# Patient Record
Sex: Male | Born: 1970 | Race: Black or African American | Hispanic: No | Marital: Married | Smoking: Current every day smoker
Health system: Southern US, Community
[De-identification: ages and names within clinical notes are randomized; demographics above are authoritative.]

## PROBLEM LIST (undated history)

## (undated) DIAGNOSIS — Z21 Asymptomatic human immunodeficiency virus [HIV] infection status: Secondary | ICD-10-CM

## (undated) DIAGNOSIS — J90 Pleural effusion, not elsewhere classified: Secondary | ICD-10-CM

---

## 2012-05-06 ENCOUNTER — Encounter (HOSPITAL_COMMUNITY): Payer: Self-pay | Admitting: Adult Health

## 2012-05-06 ENCOUNTER — Emergency Department (HOSPITAL_COMMUNITY)
Admission: EM | Admit: 2012-05-06 | Discharge: 2012-05-06 | Disposition: A | Discharge status: Home / Self Care | Payer: Self-pay | Attending: Emergency Medicine | Admitting: Emergency Medicine

## 2012-05-06 DIAGNOSIS — F17200 Nicotine dependence, unspecified, uncomplicated: Secondary | ICD-10-CM | POA: Insufficient documentation

## 2012-05-06 DIAGNOSIS — Y92009 Unspecified place in unspecified non-institutional (private) residence as the place of occurrence of the external cause: Secondary | ICD-10-CM | POA: Insufficient documentation

## 2012-05-06 DIAGNOSIS — Z23 Encounter for immunization: Secondary | ICD-10-CM | POA: Insufficient documentation

## 2012-05-06 DIAGNOSIS — T23199A Burn of first degree of multiple sites of unspecified wrist and hand, initial encounter: Secondary | ICD-10-CM | POA: Insufficient documentation

## 2012-05-06 DIAGNOSIS — IMO0002 Reserved for concepts with insufficient information to code with codable children: Secondary | ICD-10-CM | POA: Insufficient documentation

## 2012-05-06 DIAGNOSIS — T304 Corrosion of unspecified body region, unspecified degree: Secondary | ICD-10-CM

## 2012-05-06 MED ORDER — HYDROCODONE-ACETAMINOPHEN 5-500 MG PO TABS: 1.0000 | ORAL_TABLET | Freq: Four times a day (QID) | ORAL | Status: AC | PRN

## 2012-05-06 MED ORDER — TETANUS-DIPHTHERIA TOXOIDS TD 5-2 LFU IM INJ
0.5000 mL | INJECTION | Freq: Once | INTRAMUSCULAR | Status: AC
  Administered 2012-05-06: 0.5 mL via INTRAMUSCULAR
  Filled 2012-05-06: qty 0.5

## 2012-05-06 MED ORDER — HYDROCODONE-ACETAMINOPHEN 5-325 MG PO TABS
1.0000 | ORAL_TABLET | Freq: Once | ORAL | Status: AC
  Administered 2012-05-06: 1 via ORAL
  Filled 2012-05-06: qty 1

## 2012-05-06 NOTE — ED Provider Notes (Signed)
History     CSN: [id number]  Arrival date & time 05/06/12  0000   First MD Initiated Contact with Patient 05/06/12 0133      Chief Complaint  Patient presents with  . Hand Burn    (Consider location/radiation/quality/duration/timing/severity/associated sxs/prior treatment) Patient is a 41 y.o. male presenting with burn. The history is provided by the patient.  Burn The incident occurred 6 to 12 hours ago. The burns occurred at home. The burns occurred while working on a project. The burns were a result of contact with chemicals. The burns are located on the right hand. The burns appear red and painful. The pain is at a severity of 3/10. He has tried nothing for the symptoms.    History reviewed. No pertinent past medical history.  History reviewed. No pertinent past surgical history.  History reviewed. No pertinent family history.  History  Substance Use Topics  . Smoking status: Current Everyday Smoker    Types: Cigarettes  . Smokeless tobacco: Not on file  . Alcohol Use: Yes      Review of Systems  Skin: Positive for wound.    Allergies  Review of patient's allergies indicates no known allergies.  Home Medications   Current Outpatient Rx  Name Route Sig Dispense Refill  . IBUPROFEN 600 MG PO TABS Oral Take 600 mg by mouth every 6 (six) hours as needed. For pain      BP 149/60  Pulse 86  Temp 98.5 F (36.9 C) (Oral)  Resp 16  SpO2 98%  Physical Exam  Constitutional: He is oriented to person, place, and time. He appears well-developed and well-nourished.  HENT:  Head: Normocephalic and atraumatic.  Eyes: Conjunctivae are normal. Pupils are equal, round, and reactive to light.  Neck: Normal range of motion. Neck supple.  Cardiovascular: Normal rate, regular rhythm, normal heart sounds and intact distal pulses.   Pulmonary/Chest: Effort normal and breath sounds normal.  Abdominal: Soft. Bowel sounds are normal.  Neurological: He is alert and oriented to  person, place, and time.  Skin: Skin is warm and dry. There is erythema.       Erythema to rt tips of fingers,  Noncircumfrential,  + cap refill,  Normal sensation  Psychiatric: He has a normal mood and affect. His behavior is normal. Judgment and thought content normal.    ED Course  Procedures (including critical care time)  Labs Reviewed - No data to display No results found.   No diagnosis found.    MDM  + chemical burn to hand, nv intact, noncircumfrential.  Tet updated,  Will analgesia.  Not hydroflouric containing solution.  Will irrigate,  Dress,  Dc to fu        Rosanne Ashing, MD 05/06/12 [phone]

## 2012-05-06 NOTE — ED Notes (Signed)
Pt states understanding of discharge instructions 

## 2012-05-06 NOTE — ED Notes (Signed)
Approx noon today pt was working and got a Engineer, agricultural burn from a oven/grill cleaner. Pt washed burn with soap and water and vinegar as per the bottle said to do. Tonight hand is red and painful, pain is worse if it is touched or wet.

## 2013-08-24 DIAGNOSIS — L089 Local infection of the skin and subcutaneous tissue, unspecified: Secondary | ICD-10-CM | POA: Insufficient documentation

## 2013-08-28 ENCOUNTER — Encounter (HOSPITAL_COMMUNITY): Payer: Self-pay | Admitting: Emergency Medicine

## 2013-08-28 ENCOUNTER — Emergency Department (HOSPITAL_COMMUNITY)
Admission: EM | Admit: 2013-08-28 | Discharge: 2013-08-28 | Disposition: A | Discharge status: Home / Self Care | Payer: Medicaid Other | Attending: Emergency Medicine | Admitting: Emergency Medicine

## 2013-08-28 DIAGNOSIS — L738 Other specified follicular disorders: Secondary | ICD-10-CM | POA: Insufficient documentation

## 2013-08-28 DIAGNOSIS — L739 Follicular disorder, unspecified: Secondary | ICD-10-CM

## 2013-08-28 DIAGNOSIS — Z21 Asymptomatic human immunodeficiency virus [HIV] infection status: Secondary | ICD-10-CM | POA: Insufficient documentation

## 2013-08-28 DIAGNOSIS — F17200 Nicotine dependence, unspecified, uncomplicated: Secondary | ICD-10-CM | POA: Insufficient documentation

## 2013-08-28 HISTORY — DX: Asymptomatic human immunodeficiency virus (hiv) infection status: Z21

## 2013-08-28 MED ORDER — IBUPROFEN 400 MG PO TABS
800.0000 mg | ORAL_TABLET | Freq: Once | ORAL | Status: AC
  Administered 2013-08-28: 800 mg via ORAL
  Filled 2013-08-28: qty 2

## 2013-08-28 MED ORDER — IBUPROFEN 800 MG PO TABS: 800.0000 mg | ORAL_TABLET | Freq: Three times a day (TID) | ORAL | Status: DC

## 2013-08-28 MED ORDER — DIPHENHYDRAMINE HCL 25 MG PO CAPS
25.0000 mg | ORAL_CAPSULE | Freq: Once | ORAL | Status: AC
  Administered 2013-08-28: 25 mg via ORAL
  Filled 2013-08-28: qty 1

## 2013-08-28 MED ORDER — DIPHENHYDRAMINE HCL 25 MG PO TABS: 25.0000 mg | ORAL_TABLET | Freq: Four times a day (QID) | ORAL | Status: DC | PRN

## 2013-08-28 NOTE — ED Notes (Signed)
Pt returns for non-improving rash to scrotum.

## 2013-08-28 NOTE — ED Notes (Signed)
Pt is taking sulfa antibiotics for scrotal rash x 3 days.  States rash is not improving.  Denies urinary s/s or penile discharge.

## 2013-08-28 NOTE — ED Provider Notes (Signed)
CSN: [id number]     Arrival date & time 08/28/13  1622 History   None    This chart was scribed for non-physician practitioner, Francee Piccolo PA-C working with Layla Maw Ward, DO by Arlan Organ, ED Scribe. This patient was seen in room TR10C/TR10C and the patient's care was started at 5:18 PM.   Chief Complaint  Patient presents with  . Rash    The history is provided by the patient. No language interpreter was used.   HPI Comments: Alexander Patton is a 42 y.o. male who presents to the Emergency Department complaining of a rash to the right scrotal area that first appeared 2-3 days ago. Pt describes the discomfort as "itching" and burning". He denies trying benadryl or motrin for the discomfort. Pt also reports "small bumps" to the area. Pt states he saw a previous provider on 08/25/13 for the same complaint and was given Bactroban and Bactrim DS. He says he has not noticed any improvement to the area, but states his symptoms are not worsening. Pt states when using the Bactroban he reports severe burning and was told to d/c medication by the prescriber. He denies any h/o previous bumps. He denies unprotected sex, sleeping in new environments, and hot tube use. He denies scrotal/penile swelling, discharge, fever, abdominal pain, or pain to his buttocks.  Past Medical History  Diagnosis Date  . HIV positive    History reviewed. No pertinent past surgical history. No family history on file. History  Substance Use Topics  . Smoking status: Current Every Day Smoker -- 0.00 packs/day    Types: Cigarettes  . Smokeless tobacco: Not on file  . Alcohol Use: Yes    Review of Systems  Constitutional: Negative for fever.  Gastrointestinal: Negative for nausea.  Genitourinary: Negative for dysuria, flank pain, discharge, penile swelling, scrotal swelling, genital sores, penile pain and testicular pain.  Musculoskeletal: Negative for back pain.  Skin: Positive for rash.    Allergies   Miconazole-zinc oxide-petrolat  Home Medications   Current Outpatient Rx  Name  Route  Sig  Dispense  Refill  . sulfamethoxazole-trimethoprim (BACTRIM DS) 800-160 MG per tablet   Oral   Take 1 tablet by mouth every 12 (twelve) hours. START 12.3.14 for 10 days END 12.13.14         . diphenhydrAMINE (BENADRYL) 25 MG tablet   Oral   Take 1 tablet (25 mg total) by mouth every 6 (six) hours as needed for itching (Rash).   30 tablet   0   . ibuprofen (ADVIL,MOTRIN) 800 MG tablet   Oral   Take 1 tablet (800 mg total) by mouth 3 (three) times daily.   21 tablet   0    Triage Vitals: BP 129/85  Pulse 71  Temp(Src) 97.7 F (36.5 C) (Oral)  Resp 18  Ht 5\' 11"  (1.803 m)  Wt 150 lb (68.04 kg)  BMI 20.93 kg/m2  SpO2 100%  Physical Exam  Nursing note and vitals reviewed. Constitutional: He is oriented to person, place, and time. He appears well-developed and well-nourished. No distress.  HENT:  Head: Normocephalic and atraumatic.  Right Ear: External ear normal.  Left Ear: External ear normal.  Nose: Nose normal.  Mouth/Throat: Oropharynx is clear and moist.  Eyes: Conjunctivae and EOM are normal.  Neck: Normal range of motion. Neck supple.  Cardiovascular: Normal rate.   Pulmonary/Chest: Effort normal.  Abdominal: Soft.  Genitourinary: Testes normal and penis normal. No penile tenderness. No discharge found.  Musculoskeletal:  Normal range of motion.  Lymphadenopathy:       Right: No inguinal adenopathy present.       Left: No inguinal adenopathy present.  Neurological: He is alert and oriented to person, place, and time.  Skin: Skin is warm and dry. Rash noted. Rash is pustular (less than 5 mm; non tender on scrotal region; non erythematous; no drainage noted). He is not diaphoretic.  Psychiatric: He has a normal mood and affect. His behavior is normal.    ED Course  Procedures (including critical care time)  DIAGNOSTIC STUDIES: Oxygen Saturation is 100% on RA,  Normal by my interpretation.    COORDINATION OF CARE: 5:19 PM-Discussed treatment plan with pt at bedside and pt agreed to plan.     Labs Review Labs Reviewed - No data to display Imaging Review No results found.  EKG Interpretation   None       MDM   1. Folliculitis     Afebrile, NAD, non-toxic appearing, AAOx4. Pt w/ rash consistent w/ folliculitis. GU exam otherwise unremarkable. No concern for cellulitis or fournier gangrene. Advised patient to finish antibiotic course, retry Bactroban cream, and use warm compresses to help with rash. Prescribed motrin and benadryl for itching. Advised f/u w/ original prescriber. Return precautions discussed. Patient is agreeable to plan. Patient is stable at time of discharge. Patient d/w with Dr. Elesa Massed, agrees with plan.        I personally performed the services described in this documentation, which was scribed in my presence. The recorded information has been reviewed and is accurate.    Jeannetta Ellis, PA-C 08/28/13 2220

## 2013-08-29 NOTE — ED Provider Notes (Signed)
Medical screening examination/treatment/procedure(s) were performed by non-physician practitioner and as supervising physician I was immediately available for consultation/collaboration.  EKG Interpretation   None         Layla Maw Ward, DO 08/29/13 0000

## 2013-11-27 DIAGNOSIS — M79673 Pain in unspecified foot: Secondary | ICD-10-CM | POA: Insufficient documentation

## 2013-11-27 DIAGNOSIS — B070 Plantar wart: Secondary | ICD-10-CM | POA: Insufficient documentation

## 2013-12-28 ENCOUNTER — Emergency Department (HOSPITAL_COMMUNITY): Payer: Medicaid Other

## 2013-12-28 ENCOUNTER — Inpatient Hospital Stay (HOSPITAL_COMMUNITY)
Admission: EM | Admit: 2013-12-28 | Discharge: 2014-01-01 | DRG: 975 | Disposition: A | Discharge status: Home / Self Care | Payer: Medicaid Other | Attending: Internal Medicine | Admitting: Internal Medicine

## 2013-12-28 ENCOUNTER — Encounter (HOSPITAL_COMMUNITY): Payer: Self-pay | Admitting: Emergency Medicine

## 2013-12-28 DIAGNOSIS — Z79899 Other long term (current) drug therapy: Secondary | ICD-10-CM

## 2013-12-28 DIAGNOSIS — IMO0002 Reserved for concepts with insufficient information to code with codable children: Secondary | ICD-10-CM

## 2013-12-28 DIAGNOSIS — J90 Pleural effusion, not elsewhere classified: Secondary | ICD-10-CM | POA: Diagnosis present

## 2013-12-28 DIAGNOSIS — F17200 Nicotine dependence, unspecified, uncomplicated: Secondary | ICD-10-CM | POA: Diagnosis present

## 2013-12-28 DIAGNOSIS — A1889 Tuberculosis of other sites: Secondary | ICD-10-CM

## 2013-12-28 DIAGNOSIS — B20 Human immunodeficiency virus [HIV] disease: Principal | ICD-10-CM | POA: Diagnosis present

## 2013-12-28 DIAGNOSIS — J189 Pneumonia, unspecified organism: Secondary | ICD-10-CM | POA: Diagnosis present

## 2013-12-28 DIAGNOSIS — E441 Mild protein-calorie malnutrition: Secondary | ICD-10-CM | POA: Diagnosis present

## 2013-12-28 DIAGNOSIS — A159 Respiratory tuberculosis unspecified: Secondary | ICD-10-CM

## 2013-12-28 HISTORY — DX: Pleural effusion, not elsewhere classified: J90

## 2013-12-28 LAB — BASIC METABOLIC PANEL
BUN: 13 mg/dL (ref 6–23)
CHLORIDE: 98 meq/L (ref 96–112)
CO2: 23 meq/L (ref 19–32)
Calcium: 8.4 mg/dL (ref 8.4–10.5)
Creatinine, Ser: 0.9 mg/dL (ref 0.50–1.35)
GFR calc Af Amer: >90 mL/min (ref >90)
GFR calc non Af Amer: >90 mL/min (ref >90)
Glucose, Bld: 105 mg/dL — ABNORMAL HIGH (ref 70–99)
Potassium: 3.9 mEq/L (ref 3.7–5.3)
SODIUM: 135 meq/L — AB (ref 137–147)

## 2013-12-28 LAB — CBC
HCT: 38.1 % — ABNORMAL LOW (ref 39.0–52.0)
Hemoglobin: 13.2 g/dL (ref 13.0–17.0)
MCH: 31 pg (ref 26.0–34.0)
MCHC: 34.6 g/dL (ref 30.0–36.0)
MCV: 89.4 fL (ref 78.0–100.0)
PLATELETS: 261 10*3/uL (ref 150–400)
RBC: 4.26 MIL/uL (ref 4.22–5.81)
RDW: 13.7 % (ref 11.5–15.5)
WBC: 8.3 10*3/uL (ref 4.0–10.5)

## 2013-12-28 LAB — I-STAT TROPONIN, ED: TROPONIN I, POC: 0.01 ng/mL (ref 0.00–0.08)

## 2013-12-28 LAB — PRO B NATRIURETIC PEPTIDE: PRO B NATRI PEPTIDE: 50.8 pg/mL (ref 0–125)

## 2013-12-28 NOTE — ED Notes (Addendum)
Pt states CP since yesterday with nothing making the pain better or worse.  Pt states cough that makes "It feel like everything is coming up."  Pt states had CP over the winter that was similar and was told it was from a cold he had that was also associated with a cough.  Pt states intermitted pain in left side of his back and left hand tingling that is not currently present

## 2013-12-28 NOTE — ED Notes (Signed)
Pt. reports intermittent left chest pain radiating to left arm with slight SOB and occasional dry cough , denies nausea or diaphoresis , no fever or chills.

## 2013-12-29 ENCOUNTER — Encounter (HOSPITAL_COMMUNITY): Payer: Self-pay | Admitting: *Deleted

## 2013-12-29 ENCOUNTER — Inpatient Hospital Stay (HOSPITAL_COMMUNITY): Payer: Medicaid Other

## 2013-12-29 DIAGNOSIS — J189 Pneumonia, unspecified organism: Secondary | ICD-10-CM

## 2013-12-29 DIAGNOSIS — B20 Human immunodeficiency virus [HIV] disease: Principal | ICD-10-CM

## 2013-12-29 DIAGNOSIS — K219 Gastro-esophageal reflux disease without esophagitis: Secondary | ICD-10-CM | POA: Insufficient documentation

## 2013-12-29 DIAGNOSIS — Z21 Asymptomatic human immunodeficiency virus [HIV] infection status: Secondary | ICD-10-CM

## 2013-12-29 DIAGNOSIS — J90 Pleural effusion, not elsewhere classified: Secondary | ICD-10-CM

## 2013-12-29 HISTORY — DX: Pleural effusion, not elsewhere classified: J90

## 2013-12-29 LAB — CBC WITH DIFFERENTIAL/PLATELET
BASOS PCT: 0 % (ref 0–1)
Basophils Absolute: 0 10*3/uL (ref 0.0–0.1)
EOS ABS: 0.4 10*3/uL (ref 0.0–0.7)
Eosinophils Relative: 6 % — ABNORMAL HIGH (ref 0–5)
HCT: 35.8 % — ABNORMAL LOW (ref 39.0–52.0)
HEMOGLOBIN: 12.3 g/dL — AB (ref 13.0–17.0)
LYMPHS ABS: 1.3 10*3/uL (ref 0.7–4.0)
Lymphocytes Relative: 20 % (ref 12–46)
MCH: 30.8 pg (ref 26.0–34.0)
MCHC: 34.4 g/dL (ref 30.0–36.0)
MCV: 89.7 fL (ref 78.0–100.0)
MONOS PCT: 12 % (ref 3–12)
Monocytes Absolute: 0.8 10*3/uL (ref 0.1–1.0)
Neutro Abs: 4.2 10*3/uL (ref 1.7–7.7)
Neutrophils Relative %: 62 % (ref 43–77)
Platelets: 242 10*3/uL (ref 150–400)
RBC: 3.99 MIL/uL — AB (ref 4.22–5.81)
RDW: 13.8 % (ref 11.5–15.5)
WBC: 6.7 10*3/uL (ref 4.0–10.5)

## 2013-12-29 LAB — BODY FLUID CELL COUNT WITH DIFFERENTIAL
EOS FL: 1 %
Lymphs, Fluid: 30 %
Monocyte-Macrophage-Serous Fluid: 0 % — ABNORMAL LOW (ref 50–90)
Neutrophil Count, Fluid: 69 % — ABNORMAL HIGH (ref 0–25)
WBC FLUID: 1700 uL — AB (ref 0–1000)

## 2013-12-29 LAB — PH, BODY FLUID: pH, Fluid: 7.5

## 2013-12-29 LAB — COMPREHENSIVE METABOLIC PANEL
ALBUMIN: 2.5 g/dL — AB (ref 3.5–5.2)
ALT: 11 U/L (ref 0–53)
AST: 16 U/L (ref 0–37)
Alkaline Phosphatase: 64 U/L (ref 39–117)
BUN: 9 mg/dL (ref 6–23)
CHLORIDE: 101 meq/L (ref 96–112)
CO2: 23 mEq/L (ref 19–32)
CREATININE: 0.75 mg/dL (ref 0.50–1.35)
Calcium: 8.3 mg/dL — ABNORMAL LOW (ref 8.4–10.5)
GFR calc Af Amer: >90 mL/min (ref >90)
GFR calc non Af Amer: >90 mL/min (ref >90)
Glucose, Bld: 104 mg/dL — ABNORMAL HIGH (ref 70–99)
Potassium: 3.7 mEq/L (ref 3.7–5.3)
SODIUM: 136 meq/L — AB (ref 137–147)
Total Bilirubin: 0.3 mg/dL (ref 0.3–1.2)
Total Protein: 7.8 g/dL (ref 6.0–8.3)

## 2013-12-29 LAB — LACTATE DEHYDROGENASE, PLEURAL OR PERITONEAL FLUID: LD, Fluid: 378 U/L — ABNORMAL HIGH (ref 3–23)

## 2013-12-29 LAB — TROPONIN I: Troponin I: <0.3 ng/mL (ref <0.30)

## 2013-12-29 LAB — LACTATE DEHYDROGENASE: LDH: 238 U/L (ref 94–250)

## 2013-12-29 LAB — T-HELPER CELLS (CD4) COUNT (NOT AT ARMC)
CD4 % Helper T Cell: 12 % — ABNORMAL LOW (ref 33–55)
CD4 T Cell Abs: 160 /uL — ABNORMAL LOW (ref 400–2700)

## 2013-12-29 LAB — PROTEIN, BODY FLUID: Total protein, fluid: 6.4 g/dL

## 2013-12-29 MED ORDER — DEXTROSE 5 % IV SOLN
1.0000 g | Freq: Once | INTRAVENOUS | Status: AC
  Administered 2013-12-29: 1 g via INTRAVENOUS
  Filled 2013-12-29: qty 10

## 2013-12-29 MED ORDER — DEXTROSE 5 % IV SOLN
500.0000 mg | INTRAVENOUS | Status: DC
  Filled 2013-12-29: qty 500

## 2013-12-29 MED ORDER — AZITHROMYCIN 500 MG IV SOLR
500.0000 mg | Freq: Once | INTRAVENOUS | Status: AC
  Administered 2013-12-29: 500 mg via INTRAVENOUS
  Filled 2013-12-29: qty 500

## 2013-12-29 MED ORDER — SODIUM CHLORIDE 0.9 % IV SOLN
INTRAVENOUS | Status: DC
  Administered 2013-12-29 – 2013-12-31 (×2): 1000 mL via INTRAVENOUS

## 2013-12-29 MED ORDER — SODIUM CHLORIDE 0.9 % IV SOLN
Freq: Once | INTRAVENOUS | Status: AC
  Administered 2013-12-29: 02:00:00 via INTRAVENOUS

## 2013-12-29 MED ORDER — MORPHINE SULFATE 2 MG/ML IJ SOLN
1.0000 mg | INTRAMUSCULAR | Status: DC | PRN
  Administered 2013-12-29 – 2013-12-31 (×7): 1 mg via INTRAVENOUS
  Filled 2013-12-29 (×7): qty 1

## 2013-12-29 MED ORDER — ACETAMINOPHEN 325 MG PO TABS
650.0000 mg | ORAL_TABLET | Freq: Four times a day (QID) | ORAL | Status: DC | PRN
  Administered 2013-12-29: 650 mg via ORAL
  Filled 2013-12-29: qty 2

## 2013-12-29 MED ORDER — LORATADINE 10 MG PO TABS
10.0000 mg | ORAL_TABLET | Freq: Every day | ORAL | Status: DC
  Administered 2013-12-29 – 2014-01-01 (×4): 10 mg via ORAL
  Filled 2013-12-29 (×5): qty 1

## 2013-12-29 MED ORDER — ONDANSETRON HCL 4 MG PO TABS: 4.0000 mg | ORAL_TABLET | Freq: Four times a day (QID) | ORAL | Status: DC | PRN

## 2013-12-29 MED ORDER — SULFAMETHOXAZOLE-TMP DS 800-160 MG PO TABS
1.0000 | ORAL_TABLET | Freq: Every day | ORAL | Status: DC
  Administered 2013-12-29 – 2014-01-01 (×4): 1 via ORAL
  Filled 2013-12-29 (×5): qty 1

## 2013-12-29 MED ORDER — DEXTROSE 5 % IV SOLN
1.0000 g | INTRAVENOUS | Status: DC
  Administered 2013-12-30 – 2013-12-31 (×2): 1 g via INTRAVENOUS
  Filled 2013-12-29 (×3): qty 10

## 2013-12-29 MED ORDER — SULFAMETHOXAZOLE-TMP DS 800-160 MG PO TABS
2.0000 | ORAL_TABLET | Freq: Once | ORAL | Status: AC
  Administered 2013-12-29: 2 via ORAL
  Filled 2013-12-29: qty 2

## 2013-12-29 MED ORDER — ONDANSETRON HCL 4 MG/2ML IJ SOLN: 4.0000 mg | Freq: Four times a day (QID) | INTRAMUSCULAR | Status: DC | PRN

## 2013-12-29 MED ORDER — ELVITEG-COBIC-EMTRICIT-TENOFDF 150-150-200-300 MG PO TABS
1.0000 | ORAL_TABLET | Freq: Every day | ORAL | Status: DC
  Administered 2013-12-29 – 2013-12-31 (×3): 1 via ORAL
  Filled 2013-12-29 (×4): qty 1

## 2013-12-29 MED ORDER — ACETAMINOPHEN 650 MG RE SUPP: 650.0000 mg | Freq: Four times a day (QID) | RECTAL | Status: DC | PRN

## 2013-12-29 MED ORDER — TUBERCULIN PPD 5 UNIT/0.1ML ID SOLN
5.0000 [IU] | Freq: Once | INTRADERMAL | Status: AC
  Administered 2013-12-29: 5 [IU] via INTRADERMAL
  Filled 2013-12-29: qty 0.1

## 2013-12-29 MED ORDER — SODIUM CHLORIDE 0.9 % IJ SOLN
3.0000 mL | Freq: Two times a day (BID) | INTRAMUSCULAR | Status: DC
  Administered 2013-12-29 – 2014-01-01 (×7): 3 mL via INTRAVENOUS

## 2013-12-29 NOTE — Progress Notes (Signed)
Report called to R.N. Patient transfer via bed with R.N. And belongings.to [address] [place]

## 2013-12-29 NOTE — H&P (Signed)
[facility] History and Physical  Alexander RandWilliam Grade BJY:[id number] DOB: 09/24/1971 DOA: 12/28/2013  Referring physician: ER physician. PCP: No PCP Per Patient  Chief Complaint: Chest pain.  HPI: Alexander Patton is a 43 y.o. male with history of HIV being treated [place] and patient states his last CD4 count was 49 2 months ago presents to the ER with complaints of left-sided chest pain. Patient has been having left-sided chest pain last 2 days which increases on deep inspiration and has nonproductive cough. Denies any fever chills night sweats. Denies any recent travel outside [place]. Patient states he has been taking his HIV medications regularly. In the ER chest x-ray shows moderate to large left pleural effusion with possible pneumonia. Patient has been empirically started antibiotics. Patient denies any nausea vomiting abdominal pain diarrhea.   Review of Systems: As presented in the history of presenting illness, rest negative.  Past Medical History  Diagnosis Date  . HIV positive   . Pleural effusion, left 12/29/2013   History reviewed. No pertinent past surgical history. Social History:  reports that he has been smoking Cigarettes.  He has been smoking about 0.00 packs per day. He does not have any smokeless tobacco history on file. He reports that he drinks alcohol. He reports that he does not use illicit drugs. Where does patient live home. Can patient participate in ADLs? Yes.  Allergies  Allergen Reactions  . Miconazole-Zinc Oxide-Petrolat Hives    Family History:  Family History  Problem Relation Age of Onset  . Stroke Mother       Prior to Admission medications   Medication Sig Start Date End Date Taking? Authorizing Provider  elvitegravir-cobicistat-emtricitabine-tenofovir (STRIBILD) 150-150-200-300 MG TABS tablet Take 1 tablet by mouth daily with breakfast.   Yes Historical Provider, MD  loratadine (CLARITIN) 10 MG tablet Take 10 mg by mouth daily.    Yes Historical Provider, MD  naproxen (NAPROSYN) 500 MG tablet Take 500 mg by mouth 2 (two) times daily with a meal.   Yes Historical Provider, MD  sulfamethoxazole-trimethoprim (BACTRIM DS) 800-160 MG per tablet Take 1 tablet by mouth once. For 30 days 11/27/13  Yes Historical Provider, MD    Physical Exam: Filed Vitals:   12/29/13 0022 12/29/13 0045 12/29/13 0100 12/29/13 0120  BP: 134/73 116/78 124/79 127/69  Pulse: 98 93 89 92  Temp: 98.4 F (36.9 C)   98.2 F (36.8 C)  TempSrc: Oral   Oral  Resp: 31 23 26 20   Height:    5\' 11"  (1.803 m)  Weight:    71.4 kg (157 lb 6.5 oz)  SpO2: 97% 95% 95% 98%     General:  Well-developed and moderately nourished.  Eyes: Anicteric no pallor.  ENT: No discharge from the ears eyes nose mouth.  Neck: No mass felt.  Cardiovascular: S1-S2 heard.  Respiratory: Bilateral entry present decreased on the left side.  Abdomen: Soft nontender bowel sounds present. No guarding or rigidity.  Skin: No rash.  Musculoskeletal: No edema.  Psychiatric: Appears normal.  Neurologic: Alert awake oriented to time place and person. Moves all extremities.  Labs on Admission:  Basic Metabolic Panel:  Recent Labs Lab 12/28/13 2220  NA 135*  K 3.9  CL 98  CO2 23  GLUCOSE 105*  BUN 13  CREATININE 0.90  CALCIUM 8.4   Liver Function Tests: No results found for this basename: AST, ALT, ALKPHOS, BILITOT, PROT, ALBUMIN,  in the last 168 hours No results found for this basename: LIPASE, AMYLASE,  in the last 168 hours No results found for this basename: AMMONIA,  in the last 168 hours CBC:  Recent Labs Lab 12/28/13 2220  WBC 8.3  HGB 13.2  HCT 38.1*  MCV 89.4  PLT 261   Cardiac Enzymes: No results found for this basename: CKTOTAL, CKMB, CKMBINDEX, TROPONINI,  in the last 168 hours  BNP (last 3 results)  Recent Labs  12/28/13 2210  PROBNP 50.8   CBG: No results found for this basename: GLUCAP,  in the last 168 hours  Radiological  Exams on Admission: Dg Chest 2 View  12/28/2013   CLINICAL DATA:  Left-sided chest pain.  EXAM: CHEST  2 VIEW  COMPARISON:  None.  FINDINGS: A moderate to large left-sided pleural effusion is noted, with left basilar airspace opacification. This could reflect pneumonia. The right lung appears relatively clear. No pneumothorax is seen.  The heart is mildly enlarged. No acute osseous abnormalities are identified.  IMPRESSION: Moderate to large left-sided pleural effusion, with left basilar airspace opacification. This could reflect pneumonia. Would perform follow-up chest radiograph after completion of treatment for the acute process, to ensure resolution of underlying airspace opacities and exclude an underlying mass.   Electronically Signed   By: Roanna Raider M.D.   On: 12/28/2013 23:06    EKG: Independently reviewed. Normal sinus rhythm.  Assessment/Plan Principal Problem:   Pleural effusion, left Active Problems:   HIV (human immunodeficiency virus infection)   CAP (community acquired pneumonia)   1. Left-sided pleural effusion with pleuritic chest pain - at this time patient has been empirically placed on ceftriaxone and Zithromax for community-acquired pneumonia. I have consulted pulmonary critical care for further recommendations. Patient's chest pain is pleuritic in nature and I have placed patient on when necessary pain relief medications. 2. Community-acquired pneumonia - see #1. 3. HIV - continue present medications. Check CD4 count and viral load. May discuss with infectious disease in a.m.  I have discussed with pulmonary critical care consult and at this time they have wanted patient to be placed on airborne precautions.    Code Status: Full code.  Family Communication: None.  Disposition Plan: Admit to inpatient.    Micahel Omlor N. [facility] Pager [phone].  If 7PM-7AM, please contact night-coverage [email or web address] Password [facility] 12/29/2013, 2:44  AM

## 2013-12-29 NOTE — Progress Notes (Signed)
02 sat 96% on RA and BP 60/40 per machine and 86/51 while thoracentesis procedure on going at around 1215. Also little diaphoretic, cool wash cloth applied to forehead.  Dr. Elisabeth Cara Alva at bedside and instructed to give NS bolus of 250 which pt received & tolerated well. BP 107/71, P 95. After NS bolus.  Will continue to monitor.  Amanda PeaNellie Lenwood Balsam, Charity fundraiserN.

## 2013-12-29 NOTE — Procedures (Addendum)
Thoracentesis Procedure Note  Pre-operative Diagnosis: left pleural effusion  Post-operative Diagnosis: same  Indications: left effusion, HIV  Procedure Details  Bedside US was performed for chest - pleural fluid visualised in left space as echo free space bounded by chest wall, diaphragm 7 atelectatic lung- see pic. Point marked for thoracentesis. [place] was again performed at conclusion of procedure. Residual fluid noted, no signs of pneumothorax. Consent: Informed consent was obtained. Risks of the procedure were discussed including: infection, bleeding, pain, pneumothorax.  Under sterile conditions the patient was positioned. Betadine solution and sterile drapes were utilized.  1% plain lidocaine was used to anesthetize the 7th rib space. Fluid was obtained without any difficulties and minimal blood loss.  A dressing was applied to the wound and wound care instructions were provided.   Findings 1500 ml of amber yellow pleural fluid was obtained. A sample was sent to Pathology for cytology, chemistry and cell counts, as well as for infection analysis.  Complications:  He developed hot flash with sweating whenm local anesthetic given , satn 95% RA, BP 85/54 - 250cc fluid given patient tolerated the procedure well.          Condition: stable  Plan A follow up chest x-ray was ordered. Bed Rest for 2 hours. Tylenol 650 mg. for pain.  Attending Attestation: I was present for the entire procedure, performed by steve Minor NP   Cyril Mourningakesh Terence Bart MD. FCCP. [facility] Pager [phone] If no response call [phone]

## 2013-12-29 NOTE — Progress Notes (Signed)
Patient seen and examined, admitted by Dr. Toniann FailKakrakandy this morning  Briefly 43 year old male with history of HIV, [facility] (states the ID physician is Dr. Uvaldo RisingMcNeil) presented with pleuritic chest pain, left-sided with nonproductive cough. Chest x-ray showed moderate to large left pleural effusion with possible pneumonia.  BP 111/63  Pulse 89  Temp(Src) 98.3 F (36.8 C) (Oral)  Resp 20  Ht 5\' 11"  (1.803 m)  Wt 71.4 kg (157 lb 6.5 oz)  BMI 21.96 kg/m2  SpO2 96%   Left-sided pleural effusion with CP - Dr. Toniann FailKakrakandy has consulted PCCM thoracentesis, rule out empyema or parapneumonic effusion  - Continue IV antibiotics - Continue airborne precautions until cleared by CCM or ID  HIV - Will continue current medications, check CD4 count and viral load, will obtain ID consult   Will follow   RAI,RIPUDEEP M.D. [facility] 12/29/2013, 9:46 AM  Pager: [phone]

## 2013-12-29 NOTE — Care Management Note (Addendum)
    Page 1 of 1   01/01/2014     5:20:35 PM   CARE MANAGEMENT NOTE 01/01/2014  Patient:  Alexander Patton,Alexander Patton   Account Number:  [id number]  Date Initiated:  12/29/2013  Documentation initiated by:  Oletta CohnWOOD,Jarrick Fjeld  Subjective/Objective Assessment:   43 yo male Left-sided pleural effusion with pleuritic chest pain; community-acquired pneumonia//Home with spouse     Action/Plan:   IV abx, thoracentesis//Access for Home Health needs   Anticipated DC Date:  01/01/2014   Anticipated DC Plan:  HOME/SELF CARE      DC Planning Services  CM consult      Choice offered to / List presented to:             Status of service:   Medicare Important Message given?   (If response is "NO", the following Medicare IM given date fields will be blank) Date Medicare IM given:   Date Additional Medicare IM given:    Discharge Disposition:    Per UR Regulation:  Reviewed for med. necessity/level of care/duration of stay  If discussed at Long Length of Stay Meetings, dates discussed:    Comments:  01/01/14 1700 Oletta Cohnamellia Ariani Seier, RN, BSN, [place] [phone] [facility]na Daw[facility]([place]) regarding pt going home today.  [facility] will go to his home on Monday with medications and instructions.  TB [facility] and delivered to pt room.  01/01/14 1330 Mili Piltz, RN, BSN, [employer] [phone] Verified with lab that Health Department has been contacted in regards to pt having TB.  Dr Daiva EvesVan Dam has also been in contact with RN from Health Department; but [place]

## 2013-12-29 NOTE — Consult Note (Signed)
Name: Alexander Patton MRN: [id number] DOB: 08/02/1971    ADMISSION DATE:  12/28/2013 CONSULTATION DATE:  12/29/2013  [facility] SERV[facility]:  [facility]  CHIEF COMPLAINT:  Left Chest Pain  BRIEF PATIENT DESCRIPTION: 43 year old male with history of HIV, presented 4/6 with left sided pleuritic chest pain. CXR in ED revealed large left pleural effusion. PCCM asked to see.   SIGNIFICANT EVENTS / STUDIES:  4/6 admitted  LINES / TUBES:   CULTURES: Blood x2 4/6 >>> Sputum 4/6 >>>  ANTIBIOTICS: Azithromycin 4/6 >>> Rocephin 4/6 >>> Bactrim 4/6 >>>  HISTORY OF PRESENT ILLNESS:  43 year old male, current smoker, with PMH significant for HIV. Was given diagnosis in October 2014. [facility] He has had left chest pain off and on for several months. The pain is described as intermittent, non-radiating, and worse with inhalation. This pain became progressively worse over the past 2-3 days and has been accompanied by SOB which prompted him to come to the hospital. CXR in ED demonstrated a large left pleural effusion. Reports no sick contacts. [facility] and PCCM was asked to see.   PAST MEDICAL HISTORY :  Past Medical History  Diagnosis Date  . HIV positive   . Pleural effusion, left 12/29/2013   History reviewed. No pertinent past surgical history. Prior to Admission medications   Medication Sig Start Date End Date Taking? Authorizing Provider  elvitegravir-cobicistat-emtricitabine-tenofovir (STRIBILD) 150-150-200-300 MG TABS tablet Take 1 tablet by mouth daily with breakfast.   Yes Historical Provider, MD  loratadine (CLARITIN) 10 MG tablet Take 10 mg by mouth daily.   Yes Historical Provider, MD  naproxen (NAPROSYN) 500 MG tablet Take 500 mg by mouth 2 (two) times daily with a meal.   Yes Historical Provider, MD  sulfamethoxazole-trimethoprim (BACTRIM DS) 800-160 MG per tablet Take 1 tablet by mouth once. For 30 days 11/27/13  Yes  Historical Provider, MD   Allergies  Allergen Reactions  . Miconazole-Zinc Oxide-Petrolat Hives    FAMILY HISTORY:  History reviewed. No pertinent family history. SOCIAL HISTORY:  reports that he has been smoking Cigarettes.  He has been smoking about 0.00 packs per day. He does not have any smokeless tobacco history on file. He reports that he drinks alcohol. He reports that he does not use illicit drugs.  REVIEW OF SYSTEMS:    Bolds are positive  Constitutional: weight loss, gain, night sweats, Fevers, chills, fatigue .  HEENT: headaches, Sore throat, sneezing, nasal congestion, post nasal drip, Difficulty swallowing, Tooth/dental problems, visual complaints visual changes, ear ache CV:  chest pain, radiates: ,Orthopnea, PND, swelling in lower extremities, dizziness, palpitations, syncope.  GI  heartburn, indigestion, abdominal pain, nausea, vomiting, diarrhea, change in bowel habits, loss of appetite, bloody stools.  Resp: cough, nonproductive, hemoptysis, dyspnea, chest pain, pleuritic.  Skin: rash or itching or icterus GU: dysuria, change in color of urine, urgency or frequency. flank pain, hematuria  MS: joint pain or swelling. decreased range of motion  Psych: change in mood or affect. depression or anxiety.  Neuro: difficulty with speech, weakness, numbness, ataxia   SUBJECTIVE:   VITAL SIGNS: Temp:  [98.2 F (36.8 C)-99.4 F (37.4 C)] 98.2 F (36.8 C) (04/07 0120) Pulse Rate:  [89-108] 92 (04/07 0120) Resp:  [18-31] 20 (04/07 0120) BP: (100-134)/(69-79) 127/69 mmHg (04/07 0120) SpO2:  [92 %-98 %] 98 % (04/07 0120) Weight:  [71.4 kg (157 lb 6.5 oz)] 71.4  kg (157 lb 6.5 oz) (04/07 0120)  PHYSICAL EXAMINATION: General:  Male of normal body habitus in no distress Neuro:  Alert, oriented HEENT:  [place]/AT, PERRL Neck:  Supple, no JVD noted Cardiovascular:  RRR Lungs:  Diminished L chest Abdomen:  Soft, non-distended Musculoskeletal:  No acute deformity Skin:   Intact   Recent Labs Lab 12/28/13 2220  NA 135*  K 3.9  CL 98  CO2 23  BUN 13  CREATININE 0.90  GLUCOSE 105*    Recent Labs Lab 12/28/13 2220  HGB 13.2  HCT 38.1*  WBC 8.3  PLT 261   Dg Chest 2 View  12/28/2013   CLINICAL DATA:  Left-sided chest pain.  EXAM: CHEST  2 VIEW  COMPARISON:  None.  FINDINGS: A moderate to large left-sided pleural effusion is noted, with left basilar airspace opacification. This could reflect pneumonia. The right lung appears relatively clear. No pneumothorax is seen.  The heart is mildly enlarged. No acute osseous abnormalities are identified.  IMPRESSION: Moderate to large left-sided pleural effusion, with left basilar airspace opacification. This could reflect pneumonia. Would perform follow-up chest radiograph after completion of treatment for the acute process, to ensure resolution of underlying airspace opacities and exclude an underlying mass.   Electronically Signed   By: Roanna Raider M.D.   On: 12/28/2013 23:06    ASSESSMENT / PLAN:  Large left pleural effusion -etiology unknown, cannot rule out TB, DD includes lymphoma, parapneumonic effusion HIV /? AIDS Possible LLL PNA  Check CD4 count  Will plan for diagnostic/therapeutic thoracentesis in AM, discussed risks & benefits of procedure & he is agreeable  Place PPD  Antibiotics per primary team, consider ID input.       Joneen Roach, ACNP [facility] Pager [phone] or [phone]  Independently examined pt, evaluated data & formulated above care plan with NP who scribed this note & edited by me.   ALVA,RAKESH V.

## 2013-12-29 NOTE — ED Notes (Signed)
Pt's blood cultures drawn prior to antibiotics being administered.

## 2013-12-29 NOTE — ED Provider Notes (Signed)
CSN: [id number]     Arrival date & time 12/28/13  2145 History   First MD Initiated Contact with Patient 12/28/13 2315     Chief Complaint  Patient presents with  . Chest Pain     (Consider location/radiation/quality/duration/timing/severity/associated sxs/prior Treatment) HPI Comments: Pt has been having left sided chest pain, off on for few weeks. Pt comes in today with the chest pain, that was more severe than usual. Describes the pain as sharp pains, radiating to the back, and he had associated dib, no nausea, diophoresis. Pt 's pain is constant, worse with walking, deep breathing. Pt's pmhx is significant for HIV AIDS, states his last CD4 was 1 - ?Marland Kitchen Pt has been taking his meds as prescribed. No productive cough, no fevers. Denies drug use, alcohol abuse - smokes 2-3 cigarrettes a day. No pain like this before.  Patient is a 43 y.o. male presenting with chest pain. The history is provided by the patient.  Chest Pain Associated symptoms: cough and shortness of breath   Associated symptoms: no abdominal pain     Past Medical History  Diagnosis Date  . HIV positive    History reviewed. No pertinent past surgical history. No family history on file. History  Substance Use Topics  . Smoking status: Current Every Day Smoker -- 0.00 packs/day    Types: Cigarettes  . Smokeless tobacco: Not on file  . Alcohol Use: Yes    Review of Systems  Constitutional: Negative for activity change and appetite change.  Respiratory: Positive for cough and shortness of breath.   Cardiovascular: Positive for chest pain.  Gastrointestinal: Negative for abdominal pain.  Genitourinary: Negative for dysuria.  Allergic/Immunologic: Positive for immunocompromised state.  All other systems reviewed and are negative.      Allergies  Miconazole-zinc oxide-petrolat  Home Medications   Current Outpatient Rx  Name  Route  Sig  Dispense  Refill  . elvitegravir-cobicistat-emtricitabine-tenofovir  (STRIBILD) 150-150-200-300 MG TABS tablet   Oral   Take 1 tablet by mouth daily with breakfast.         . loratadine (CLARITIN) 10 MG tablet   Oral   Take 10 mg by mouth daily.         . naproxen (NAPROSYN) 500 MG tablet   Oral   Take 500 mg by mouth 2 (two) times daily with a meal.         . sulfamethoxazole-trimethoprim (BACTRIM DS) 800-160 MG per tablet   Oral   Take 1 tablet by mouth once. For 30 days          BP 134/73  Pulse 98  Temp(Src) 98.4 F (36.9 C) (Oral)  Resp 31  SpO2 97% Physical Exam  Nursing note and vitals reviewed. Constitutional: He is oriented to person, place, and time. He appears well-developed.  HENT:  Head: Normocephalic and atraumatic.  Eyes: Conjunctivae and EOM are normal. Pupils are equal, round, and reactive to light.  Neck: Normal range of motion. Neck supple.  Cardiovascular: Normal rate and regular rhythm.   Pulmonary/Chest: Effort normal and breath sounds normal.  No air entry to the left side  Abdominal: Soft. Bowel sounds are normal. He exhibits no distension. There is no tenderness. There is no rebound and no guarding.  Neurological: He is alert and oriented to person, place, and time.  Skin: Skin is warm.    ED Course  Procedures (including critical care time) Labs Review Labs Reviewed  CBC - Abnormal; Notable for the following:  HCT 38.1 (*)    All other components within normal limits  BASIC METABOLIC PANEL - Abnormal; Notable for the following:    Sodium 135 (*)    Glucose, Bld 105 (*)    All other components within normal limits  CULTURE, EXPECTORATED SPUTUM-ASSESSMENT  CULTURE, BLOOD (ROUTINE X 2)  CULTURE, BLOOD (ROUTINE X 2)  PRO B NATRIURETIC PEPTIDE  I-STAT TROPOININ, ED   Imaging Review Dg Chest 2 View  12/28/2013   CLINICAL DATA:  Left-sided chest pain.  EXAM: CHEST  2 VIEW  COMPARISON:  None.  FINDINGS: A moderate to large left-sided pleural effusion is noted, with left basilar airspace opacification.  This could reflect pneumonia. The right lung appears relatively clear. No pneumothorax is seen.  The heart is mildly enlarged. No acute osseous abnormalities are identified.  IMPRESSION: Moderate to large left-sided pleural effusion, with left basilar airspace opacification. This could reflect pneumonia. Would perform follow-up chest radiograph after completion of treatment for the acute process, to ensure resolution of underlying airspace opacities and exclude an underlying mass.   Electronically Signed   By: Roanna RaiderJeffery  Chang M.D.   On: 12/28/2013 23:06     EKG Interpretation None      MDM   Final diagnoses:  CAP (community acquired pneumonia)  Pleural effusion on left  HIV - likely AIDS  Pt w/ HIV comes in with left sided chest pain - mostly on the posterior end. Is exertioanl and pleuritic. No air entry on my exam - Xray shlws large pleural effusion.  DDX - malignant vs. Infectious pleural effusion. Will cover with Ceftriaxone and AZT, and bactrim. Will need evaluation of the effusion. Hemodynamically stable, no O2 need.   Derwood KaplanAnkit Taiki Buckwalter, MD 12/29/13 [phone]

## 2013-12-29 NOTE — Consult Note (Signed)
[facility]    Date of Admission:  12/28/2013  Date of Consult:  12/29/2013  Reason for Consult: Pt with large left sided effusion, PNA, HIV/AIDS Referring Physician: Dr. Tana Coast   HPI: Alexander Patton is an 43 y.o. male with HIV that was diagnosed this fall recently started on STRIBILD with centrally perfect virological suppression with most recent viral load on March 27 being 25 copies per milliliter of blood. CD4 count has yet not yet risen above 200 with most recent bout value at 130.  He states is been highly compliant with his antiretroviral medications and he is enrolled in the AIDS drug assistance program.  He's been having trouble with cough and pleuritic chest pain for some time now but not been evaluated until he was seen in emergent chart department at [facility], and a chest x-ray which are large left-sided pleural effusion.  The patient and CCM and perform thoracocentesis which showed an LDH of 378, protein of 6.4 and 1700 WBC 69% PMNs.   PPD has been placed. He has been in prison for 30 days several years ago but no other potential TB exposure though risk obviously much higher with HIV +.   Past Medical History  Diagnosis Date  . HIV positive   . Pleural effusion, left 12/29/2013    History reviewed. No pertinent past surgical history.ergies:   Allergies  Allergen Reactions  . Miconazole-Zinc Oxide-Petrolat Hives     Medications: I have reviewed patients current medications as documented in Epic Anti-infectives   Start     Dose/Rate Route Frequency Ordered Stop   12/30/13 0200  cefTRIAXone (ROCEPHIN) 1 g in dextrose 5 % 50 mL IVPB     1 g 100 mL/hr over 30 Minutes Intravenous Every 24 hours 12/29/13 0243     12/30/13 0200  azithromycin (ZITHROMAX) 500 mg in dextrose 5 % 250 mL IVPB  Status:  Discontinued     500 mg 250 mL/hr over 60 Minutes Intravenous Every 24 hours 12/29/13 0243 12/29/13 1522   12/29/13 1000   sulfamethoxazole-trimethoprim (BACTRIM DS) 800-160 MG per tablet 1 tablet     1 tablet Oral Daily 12/29/13 0243     12/29/13 0700  elvitegravir-cobicistat-emtricitabine-tenofovir (STRIBILD) 150-150-200-300 MG tablet 1 tablet     1 tablet Oral Daily with breakfast 12/29/13 0243     12/29/13 0030  sulfamethoxazole-trimethoprim (BACTRIM DS) 800-160 MG per tablet 2 tablet     2 tablet Oral  Once 12/29/13 0026 12/29/13 0041   12/29/13 0030  cefTRIAXone (ROCEPHIN) 1 g in dextrose 5 % 50 mL IVPB     1 g 100 mL/hr over 30 Minutes Intravenous  Once 12/29/13 0026 12/29/13 0114   12/29/13 0030  azithromycin (ZITHROMAX) 500 mg in dextrose 5 % 250 mL IVPB     500 mg 250 mL/hr over 60 Minutes Intravenous  Once 12/29/13 0026 12/29/13 0341      Social History:  reports that he has been smoking Cigarettes.  He has been smoking about 0.00 packs per day. He does not have any smokeless tobacco history on file. He reports that he drinks alcohol. He reports that he does not use illicit drugs.  Family History  Problem Relation Age of Onset  . Stroke Mother     As in HPI and primary teams notes otherwise 12 point review of systems is negative  Blood pressure 107/71, pulse 95, temperature 98 F (36.7 C), temperature source Oral, resp. rate 18, height _0  (1.803  m), weight 157 lb 6.5 oz (71.4 kg), SpO2 98.00%. General: Alert and awake, oriented x3, not in any acute distress. HEENT: anicteric sclera, pupils reactive to light and accommodation, EOMI, oropharynx clear and without exudate CVS regular rate, normal r,  no murmur rubs or gallops Chest: Diminished breath sounds in left base  Abdomen: soft nontender, nondistended, normal bowel sounds, Extremities: no  clubbing or edema noted bilaterally Skin: no rashes Neuro: nonfocal, strength and sensation intact   Results for orders placed during the hospital encounter of 12/28/13 (from the past 48 hour(s))  PRO B NATRIURETIC PEPTIDE     Status: None    Collection Time    12/28/13 10:10 PM      Result Value Ref Range   Pro B Natriuretic peptide (BNP) 50.8  0 - 125 pg/mL  CBC     Status: Abnormal   Collection Time    12/28/13 10:20 PM      Result Value Ref Range   WBC 8.3  4.0 - 10.5 K/uL   RBC 4.26  4.22 - 5.81 MIL/uL   Hemoglobin 13.2  13.0 - 17.0 g/dL   HCT 38.1 (*) 39.0 - 52.0 %   MCV 89.4  78.0 - 100.0 fL   MCH 31.0  26.0 - 34.0 pg   MCHC 34.6  30.0 - 36.0 g/dL   RDW 13.7  11.5 - 15.5 %   Platelets 261  150 - 400 K/uL  BASIC METABOLIC PANEL     Status: Abnormal   Collection Time    12/28/13 10:20 PM      Result Value Ref Range   Sodium 135 (*) 137 - 147 mEq/L   Potassium 3.9  3.7 - 5.3 mEq/L   Chloride 98  96 - 112 mEq/L   CO2 23  19 - 32 mEq/L   Glucose, Bld 105 (*) 70 - 99 mg/dL   BUN 13  6 - 23 mg/dL   Creatinine, Ser 0.90  0.50 - 1.35 mg/dL   Calcium 8.4  8.4 - 10.5 mg/dL   GFR calc non Af Amer >90  >90 mL/min   GFR calc Af Amer >90  >90 mL/min   Comment: (NOTE)     The eGFR has been calculated using the CKD EPI equation.     This calculation has not been validated in all clinical situations.     eGFR's persistently <90 mL/min signify possible Chronic Kidney     Disease.  Randolm Idol, ED     Status: None   Collection Time    12/28/13 10:23 PM      Result Value Ref Range   Troponin i, poc 0.01  0.00 - 0.08 ng/mL   Comment 3            Comment: Due to the release kinetics of cTnI,     a negative result within the first hours     of the onset of symptoms does not rule out     myocardial infarction with certainty.     If myocardial infarction is still suspected,     repeat the test at appropriate intervals.  T-HELPER CELLS (CD4) COUNT     Status: Abnormal   Collection Time    12/29/13  5:38 AM      Result Value Ref Range   CD4 T Cell Abs 160 (*) 400 - 2700 /uL   CD4 % Helper T Cell 12 (*) 33 - 55 %   Comment: Performed at [facility]  COMPREHENSIVE METABOLIC PANEL     Status:  Abnormal   Collection Time    12/29/13  5:38 AM      Result Value Ref Range   Sodium 136 (*) 137 - 147 mEq/L   Potassium 3.7  3.7 - 5.3 mEq/L   Chloride 101  96 - 112 mEq/L   CO2 23  19 - 32 mEq/L   Glucose, Bld 104 (*) 70 - 99 mg/dL   BUN 9  6 - 23 mg/dL   Creatinine, Ser 0.75  0.50 - 1.35 mg/dL   Calcium 8.3 (*) 8.4 - 10.5 mg/dL   Total Protein 7.8  6.0 - 8.3 g/dL   Albumin 2.5 (*) 3.5 - 5.2 g/dL   AST 16  0 - 37 U/L   ALT 11  0 - 53 U/L   Alkaline Phosphatase 64  39 - 117 U/L   Total Bilirubin 0.3  0.3 - 1.2 mg/dL   GFR calc non Af Amer >90  >90 mL/min   GFR calc Af Amer >90  >90 mL/min   Comment: (NOTE)     The eGFR has been calculated using the CKD EPI equation.     This calculation has not been validated in all clinical situations.     eGFR's persistently <90 mL/min signify possible Chronic Kidney     Disease.  CBC WITH DIFFERENTIAL     Status: Abnormal   Collection Time    12/29/13  5:38 AM      Result Value Ref Range   WBC 6.7  4.0 - 10.5 K/uL   RBC 3.99 (*) 4.22 - 5.81 MIL/uL   Hemoglobin 12.3 (*) 13.0 - 17.0 g/dL   HCT 35.8 (*) 39.0 - 52.0 %   MCV 89.7  78.0 - 100.0 fL   MCH 30.8  26.0 - 34.0 pg   MCHC 34.4  30.0 - 36.0 g/dL   RDW 13.8  11.5 - 15.5 %   Platelets 242  150 - 400 K/uL   Neutrophils Relative % 62  43 - 77 %   Neutro Abs 4.2  1.7 - 7.7 K/uL   Lymphocytes Relative 20  12 - 46 %   Lymphs Abs 1.3  0.7 - 4.0 K/uL   Monocytes Relative 12  3 - 12 %   Monocytes Absolute 0.8  0.1 - 1.0 K/uL   Eosinophils Relative 6 (*) 0 - 5 %   Eosinophils Absolute 0.4  0.0 - 0.7 K/uL   Basophils Relative 0  0 - 1 %   Basophils Absolute 0.0  0.0 - 0.1 K/uL  TROPONIN I     Status: None   Collection Time    12/29/13  5:38 AM      Result Value Ref Range   Troponin I <0.30  <0.30 ng/mL   Comment:            Due to the release kinetics of cTnI,     a negative result within the first hours     of the onset of symptoms does not rule out     myocardial infarction with  certainty.     If myocardial infarction is still suspected,     repeat the test at appropriate intervals.  LACTATE DEHYDROGENASE, BODY FLUID     Status: Abnormal   Collection Time    12/29/13 12:54 PM      Result Value Ref Range   LD, Fluid 378 (*) 3 - 23 U/L   Fluid Type-FLDH  FLUID     Comment: LEFT     PLEURAL     CORRECTED ON 04/07 AT 1431: PREVIOUSLY REPORTED AS Pleural Fld  PROTEIN, BODY FLUID     Status: None   Collection Time    12/29/13 12:54 PM      Result Value Ref Range   Total protein, fluid 6.4     Comment: NO NORMAL RANGE ESTABLISHED FOR THIS TEST   Fluid Type-FTP FLUID     Comment: LEFT     PLEURAL     CORRECTED ON 04/07 AT 1430: PREVIOUSLY REPORTED AS Pleural Fld  BODY FLUID CELL COUNT WITH DIFFERENTIAL     Status: Abnormal   Collection Time    12/29/13 12:54 PM      Result Value Ref Range   Fluid Type-FCT FLUID     Comment: LEFT     PLEURAL     CORRECTED ON 04/07 AT 1557: PREVIOUSLY REPORTED AS Body Fluid   Color, Fluid YELLOW  YELLOW   Appearance, Fluid CLOUDY (*) CLEAR   WBC, Fluid 1700 (*) 0 - 1000 cu mm   Neutrophil Count, Fluid 69 (*) 0 - 25 %   Lymphs, Fluid 30     Monocyte-Macrophage-Serous Fluid 0 (*) 50 - 90 %   Eos, Fluid 1     Other Cells, Fluid PYNOTIC NEUTROPHILS PRESENT.    LACTATE DEHYDROGENASE     Status: None   Collection Time    12/29/13  3:40 PM      Result Value Ref Range   LDH 238  94 - 250 U/L   No results found for this basename: sdes, specrequest, cult, reptstatus   Dg Chest 2 View  12/28/2013   CLINICAL DATA:  Left-sided chest pain.  EXAM: CHEST  2 VIEW  COMPARISON:  None.  FINDINGS: A moderate to large left-sided pleural effusion is noted, with left basilar airspace opacification. This could reflect pneumonia. The right lung appears relatively clear. No pneumothorax is seen.  The heart is mildly enlarged. No acute osseous abnormalities are identified.  IMPRESSION: Moderate to large left-sided pleural effusion, with left basilar  airspace opacification. This could reflect pneumonia. Would perform follow-up chest radiograph after completion of treatment for the acute process, to ensure resolution of underlying airspace opacities and exclude an underlying mass.   Electronically Signed   By: Garald Balding M.D.   On: 12/28/2013 23:06   Dg Chest Port 1 View  12/29/2013   CLINICAL DATA:  Post left thoracentesis  EXAM: PORTABLE CHEST - 1 VIEW  COMPARISON:  12/28/2013  FINDINGS: Decreased left effusion fall following thoracentesis. Mild to moderate left thoracentesis and left lower lobe atelectasis remain. No pneumothorax. Right lung is clear.  IMPRESSION: No pneumothorax post left thoracentesis.   Electronically Signed   By: Franchot Gallo M.D.   On: 12/29/2013 12:25     No results found for this or any previous visit (from the past 720 hour(s)).   Impression/Recommendation  Principal Problem:   Pleural effusion, left Active Problems:   HIV (human immunodeficiency virus infection)   CAP (community acquired pneumonia)   Earon Rivest is a 43 y.o. male with  HIV/AIDS with perfect virological control, now with left sided exudative effusion  #1 Exudative effusion:  Agree with workup for TB  If possible would add ADA levels. Not sure if we can get IFN gamma test on fluid  Will also look at other possibilities such as PCR  I would induce 3 sputa for AFB  smear and culture to hopefully rule out pulmonary TB  Send QF gold  OK to continue the rocephin but would dc azithro to increase yield on AFB sputa  #2 HIV: --continue STRIBILD and bactrim     12/29/2013, 6:05 PM   Thank you so much for this interesting consult  [place] [facility] [place] [phone] (pager) [phone] (office) 12/29/2013, 6:05 PM  Rhina Brackett [place] 12/29/2013, 6:05 PM

## 2013-12-30 LAB — FUNGAL STAIN: Fungal Smear: NONE SEEN

## 2013-12-30 LAB — HIV-1 RNA QUANT-NO REFLEX-BLD
HIV 1 RNA QUANT: 37 {copies}/mL — AB (ref <20)
HIV-1 RNA QUANT, LOG: 1.57 {Log} — AB (ref <1.30)

## 2013-12-30 NOTE — Progress Notes (Signed)
RN informed RT that PT has sputum induction order. After RT reviewed stated order- RT informed RN that order is for AFB. AFB sample must be induced and provided prior to any ingestion of liquids and / or food. Therefore, RT will inform RT (7a-7p) that this order is pending.

## 2013-12-30 NOTE — Progress Notes (Signed)
[facility] PROGRESS NOTE  Alexander Patton ZOX:[id number] DOB: 10/31/1970 DOA: 12/28/2013 PCP: No PCP Per Patient  Assessment/Plan: 1. SOB and pleuritic chest pain: found with large pleural effusion and presumed LLL PNA -result suggesting exudatives effusion -continue rocephin and bactrim -follow cx's -Id on board will follow rec's -concerns for TB (will continue droplet isolation; PPD negative) -AFB pending. Might need FOB/BAL, PCCM on board and assisting with the case as well  2-HIV/AIDS -continue bactrim -continue stribild   Code Status: Full Family Communication: no family at bedside Disposition Plan: home when medically stable   Consultants:  ID  PCCM   Procedures:  Thoracentesis 12/29/13   Antibiotics:  Rocephin  Bactrim   HPI/Subjective: Afebrile, feeling better; still with some cough and associated pleuritic pain  Objective: Filed Vitals:   12/30/13 1452  BP: 114/72  Pulse: 95  Temp: 98.6 F (37 C)  Resp: 18    Intake/Output Summary (Last 24 hours) at 12/30/13 1731 Last data filed at 12/30/13 1451  Gross per 24 hour  Intake 1134.67 ml  Output   2650 ml  Net -1515.33 ml   Filed Weights   12/29/13 0120 12/30/13 0534  Weight: 71.4 kg (157 lb 6.5 oz) 68.584 kg (151 lb 3.2 oz)    Exam:   General:  Afebrile, feeling better; still with some cough and associated pleuritic pain  Cardiovascular: S1 and S2, no rubs or gallops  Respiratory: scattered rhonchi, no wheezing  Abdomen: soft, NT, NT positive BS  Musculoskeletal: no cyanosis or clubbing  Data Reviewed: Basic Metabolic Panel:  Recent Labs Lab 12/28/13 2220 12/29/13 0538  NA 135* 136*  K 3.9 3.7  CL 98 101  CO2 23 23  GLUCOSE 105* 104*  BUN 13 9  CREATININE 0.90 0.75  CALCIUM 8.4 8.3*   Liver Function Tests:  Recent Labs Lab 12/29/13 0538  AST 16  ALT 11  ALKPHOS 64  BILITOT 0.3  PROT 7.8  ALBUMIN 2.5*   CBC:  Recent Labs Lab 12/28/13 2220  12/29/13 0538  WBC 8.3 6.7  NEUTROABS  --  4.2  HGB 13.2 12.3*  HCT 38.1* 35.8*  MCV 89.4 89.7  PLT 261 242   Cardiac Enzymes:  Recent Labs Lab 12/29/13 0538  TROPONINI <0.30   BNP (last 3 results)  Recent Labs  12/28/13 2210  PROBNP 50.8   CBG: No results found for this basename: GLUCAP,  in the last 168 hours  Recent Results (from the past 240 hour(s))  CULTURE, BLOOD (ROUTINE X 2)     Status: None   Collection Time    12/29/13 12:35 AM      Result Value Ref Range Status   Specimen Description BLOOD RIGHT ANTECUBITAL   Final   Special Requests BOTTLES DRAWN AEROBIC AND ANAEROBIC 10CC EA   Final   Culture  Setup Time     Final   Value: 12/29/2013 04:52     [facility]   Culture     Final   Value:        BLOOD CULTURE RECEIVED NO GROWTH TO DATE CULTURE WILL BE HELD FOR 5 DAYS BEFORE ISSUING A FINAL NEGATIVE REPORT     Performed at [facility]   Report Status PENDING   Incomplete  CULTURE, BLOOD (ROUTINE X 2)     Status: None   Collection Time    12/29/13 12:38 AM      Result Value Ref Range Status   Specimen Description BLOOD LEFT  FOREARM   Final   Special Requests BOTTLES DRAWN AEROBIC AND ANAEROBIC 10CC EA   Final   Culture  Setup Time     Final   Value: 12/29/2013 04:52     Performed at [facility]   Culture     Final   Value:        BLOOD CULTURE RECEIVED NO GROWTH TO DATE CULTURE WILL BE HELD FOR 5 DAYS BEFORE ISSUING A FINAL NEGATIVE REPORT     Performed at [facility]   Report Status PENDING   Incomplete  AFB CULTURE WITH SMEAR     Status: None   Collection Time    12/29/13 12:54 PM      Result Value Ref Range Status   Specimen Description FLUID LEFT PLEURAL   Final   Special Requests NONE   Final   ACID FAST SMEAR     Final   Value: NO ACID FAST BACILLI SEEN     Performed at [facility]   Culture     Final   Value: CULTURE WILL BE EXAMINED FOR 6 WEEKS BEFORE ISSUING A FINAL REPORT      Performed at [facility]   Report Status PENDING   Incomplete  BODY FLUID CULTURE     Status: None   Collection Time    12/29/13 12:54 PM      Result Value Ref Range Status   Specimen Description FLUID LEFT PLEURAL   Final   Special Requests NONE   Final   Gram Stain     Final   Value: WBC PRESENT,BOTH PMN AND MONONUCLEAR     NO ORGANISMS SEEN     Performed at [facility]   Culture     Final   Value: NO GROWTH 1 DAY     Performed at [facility]   Report Status PENDING   Incomplete  FUNGAL STAIN     Status: None   Collection Time    12/29/13 12:54 PM      Result Value Ref Range Status   Specimen Description FLUID FLUID LEFT PLEURAL   Final   Special Requests NONE   Final   Fungal Smear     Final   Value: NO YEAST OR FUNGAL ELEMENTS SEEN     Performed at [facility]   Report Status 12/30/2013 FINAL   Final     Studies: Dg Chest 2 View  12/28/2013   CLINICAL DATA:  Left-sided chest pain.  EXAM: CHEST  2 VIEW  COMPARISON:  None.  FINDINGS: A moderate to large left-sided pleural effusion is noted, with left basilar airspace opacification. This could reflect pneumonia. The right lung appears relatively clear. No pneumothorax is seen.  The heart is mildly enlarged. No acute osseous abnormalities are identified.  IMPRESSION: Moderate to large left-sided pleural effusion, with left basilar airspace opacification. This could reflect pneumonia. Would perform follow-up chest radiograph after completion of treatment for the acute process, to ensure resolution of underlying airspace opacities and exclude an underlying mass.   Electronically Signed   By: Roanna Raider M.D.   On: 12/28/2013 23:06   Dg Chest Port 1 View  12/29/2013   CLINICAL DATA:  Post left thoracentesis  EXAM: PORTABLE CHEST - 1 VIEW  COMPARISON:  12/28/2013  FINDINGS: Decreased left effusion fall following thoracentesis. Mild to moderate left thoracentesis and left lower lobe atelectasis remain.  No pneumothorax. Right lung is clear.  IMPRESSION: No pneumothorax post  left thoracentesis.   Electronically Signed   By: Marlan Palau M.D.   On: 12/29/2013 12:25    Scheduled Meds: . cefTRIAXone (ROCEPHIN)  IV  1 g Intravenous Q24H  . elvitegravir-cobicistat-emtricitabine-tenofovir  1 tablet Oral Q breakfast  . loratadine  10 mg Oral Daily  . sodium chloride  3 mL Intravenous Q12H  . sulfamethoxazole-trimethoprim  1 tablet Oral Daily  . tuberculin  5 Units Intradermal Once   Continuous Infusions: . sodium chloride 1,000 mL (12/29/13 1000)    Time spent: >30 minutes    Vassie Loll  [facility] Pager [phone]. If 7PM-7AM, please contact night-coverage at [email or web address], password [facility] 12/30/2013, 5:31 PM  LOS: 2 days

## 2013-12-30 NOTE — Progress Notes (Signed)
[facility]    Subjective: No new complaints   Antibiotics:  Anti-infectives   Start     Dose/Rate Route Frequency Ordered Stop   12/30/13 0200  cefTRIAXone (ROCEPHIN) 1 g in dextrose 5 % 50 mL IVPB     1 g 100 mL/hr over 30 Minutes Intravenous Every 24 hours 12/29/13 0243     12/30/13 0200  azithromycin (ZITHROMAX) 500 mg in dextrose 5 % 250 mL IVPB  Status:  Discontinued     500 mg 250 mL/hr over 60 Minutes Intravenous Every 24 hours 12/29/13 0243 12/29/13 1522   12/29/13 1000  sulfamethoxazole-trimethoprim (BACTRIM DS) 800-160 MG per tablet 1 tablet     1 tablet Oral Daily 12/29/13 0243     12/29/13 0700  elvitegravir-cobicistat-emtricitabine-tenofovir (STRIBILD) 150-150-200-300 MG tablet 1 tablet     1 tablet Oral Daily with breakfast 12/29/13 0243     12/29/13 0030  sulfamethoxazole-trimethoprim (BACTRIM DS) 800-160 MG per tablet 2 tablet     2 tablet Oral  Once 12/29/13 0026 12/29/13 0041   12/29/13 0030  cefTRIAXone (ROCEPHIN) 1 g in dextrose 5 % 50 mL IVPB     1 g 100 mL/hr over 30 Minutes Intravenous  Once 12/29/13 0026 12/29/13 0114   12/29/13 0030  azithromycin (ZITHROMAX) 500 mg in dextrose 5 % 250 mL IVPB     500 mg 250 mL/hr over 60 Minutes Intravenous  Once 12/29/13 0026 12/29/13 0341      Medications: Scheduled Meds: . cefTRIAXone (ROCEPHIN)  IV  1 g Intravenous Q24H  . elvitegravir-cobicistat-emtricitabine-tenofovir  1 tablet Oral Q breakfast  . loratadine  10 mg Oral Daily  . sodium chloride  3 mL Intravenous Q12H  . sulfamethoxazole-trimethoprim  1 tablet Oral Daily  . tuberculin  5 Units Intradermal Once   Continuous Infusions: . sodium chloride 1,000 mL (12/29/13 1000)   PRN Meds:.acetaminophen, acetaminophen, morphine injection, ondansetron (ZOFRAN) IV, ondansetron    Objective: Weight change: -6 lb 3.3 oz (-2.816 kg)  Intake/Output Summary (Last 24 hours) at 12/30/13 1546 Last data filed at 12/30/13 1451  Gross  per 24 hour  Intake 1134.67 ml  Output   2650 ml  Net -1515.33 ml   Blood pressure 114/72, pulse 95, temperature 98.6 F (37 C), temperature source Oral, resp. rate 18, height 5\' 11"  (1.803 m), weight 151 lb 3.2 oz (68.584 kg), SpO2 96.00%. Temp:  [98.2 F (36.8 C)-98.6 F (37 C)] 98.6 F (37 C) (04/08 1452) Pulse Rate:  [79-95] 95 (04/08 1452) Resp:  [17-18] 18 (04/08 1452) BP: (110-115)/(68-72) 114/72 mmHg (04/08 1452) SpO2:  [96 %-98 %] 96 % (04/08 1452) Weight:  [151 lb 3.2 oz (68.584 kg)] 151 lb 3.2 oz (68.584 kg) (04/08 0534)  Physical Exam: General: Alert and awake, oriented x3, not in any acute distress.  HEENT: anicteric sclera, pupils reactive to light and accommodation, EOMI, oropharynx clear and without exudate  CVS regular rate, normal r, no murmur rubs or gallops  Chest: Diminished breath sounds in left base  Abdomen: soft nontender, nondistended, normal bowel sounds,  Extremities: no clubbing or edema noted bilaterally  Skin: no rashes  Neuro: nonfocal, strength and sensation intact   CBC:  Recent Labs Lab 12/28/13 2220 12/29/13 0538  HGB 13.2 12.3*  HCT 38.1* 35.8*  PLT 261 242     BMET  Recent Labs  12/28/13 2220 12/29/13 0538  NA 135* 136*  K 3.9 3.7  CL 98 101  CO2 23 23  GLUCOSE 105* 104*  BUN 13 9  CREATININE 0.90 0.75  CALCIUM 8.4 8.3*     Liver Panel   Recent Labs  12/29/13 0538  PROT 7.8  ALBUMIN 2.5*  AST 16  ALT 11  ALKPHOS 64  BILITOT 0.3       Sedimentation Rate No results found for this basename: ESRSEDRATE,  in the last 72 hours C-Reactive Protein No results found for this basename: CRP,  in the last 72 hours  Micro Results: Recent Results (from the past 240 hour(s))  CULTURE, BLOOD (ROUTINE X 2)     Status: None   Collection Time    12/29/13 12:35 AM      Result Value Ref Range Status   Specimen Description BLOOD RIGHT ANTECUBITAL   Final   Special Requests BOTTLES DRAWN AEROBIC AND ANAEROBIC 10CC EA    Final   Culture  Setup Time     Final   Value: 12/29/2013 04:52     Performed at [facility]   Culture     Final   Value:        BLOOD CULTURE RECEIVED NO GROWTH TO DATE CULTURE WILL BE HELD FOR 5 DAYS BEFORE ISSUING A FINAL NEGATIVE REPORT     Performed at [facility]   Report Status PENDING   Incomplete  CULTURE, BLOOD (ROUTINE X 2)     Status: None   Collection Time    12/29/13 12:38 AM      Result Value Ref Range Status   Specimen Description BLOOD LEFT FOREARM   Final   Special Requests BOTTLES DRAWN AEROBIC AND ANAEROBIC 10CC EA   Final   Culture  Setup Time     Final   Value: 12/29/2013 04:52     Performed at [facility]   Culture     Final   Value:        BLOOD CULTURE RECEIVED NO GROWTH TO DATE CULTURE WILL BE HELD FOR 5 DAYS BEFORE ISSUING A FINAL NEGATIVE REPORT     Performed at [facility]   Report Status PENDING   Incomplete  BODY FLUID CULTURE     Status: None   Collection Time    12/29/13 12:54 PM      Result Value Ref Range Status   Specimen Description FLUID LEFT PLEURAL   Final   Special Requests NONE   Final   Gram Stain     Final   Value: WBC PRESENT,BOTH PMN AND MONONUCLEAR     NO ORGANISMS SEEN     Performed at [facility]   Culture     Final   Value: NO GROWTH 1 DAY     Performed at [facility]   Report Status PENDING   Incomplete  FUNGAL STAIN     Status: None   Collection Time    12/29/13 12:54 PM      Result Value Ref Range Status   Specimen Description FLUID FLUID LEFT PLEURAL   Final   Special Requests NONE   Final   Fungal Smear     Final   Value: NO YEAST OR FUNGAL ELEMENTS SEEN     Performed at [facility]   Report Status 12/30/2013 FINAL   Final    Studies/Results: Dg Chest 2 View  12/28/2013   CLINICAL DATA:  Left-sided chest pain.  EXAM: CHEST  2 VIEW  COMPARISON:  None.  FINDINGS: A moderate to large left-sided pleural effusion is  noted, with left basilar airspace  opacification. This could reflect pneumonia. The right lung appears relatively clear. No pneumothorax is seen.  The heart is mildly enlarged. No acute osseous abnormalities are identified.  IMPRESSION: Moderate to large left-sided pleural effusion, with left basilar airspace opacification. This could reflect pneumonia. Would perform follow-up chest radiograph after completion of treatment for the acute process, to ensure resolution of underlying airspace opacities and exclude an underlying mass.   Electronically Signed   By: Roanna RaiderJeffery  Chang M.D.   On: 12/28/2013 23:06   Dg Chest Port 1 View  12/29/2013   CLINICAL DATA:  Post left thoracentesis  EXAM: PORTABLE CHEST - 1 VIEW  COMPARISON:  12/28/2013  FINDINGS: Decreased left effusion fall following thoracentesis. Mild to moderate left thoracentesis and left lower lobe atelectasis remain. No pneumothorax. Right lung is clear.  IMPRESSION: No pneumothorax post left thoracentesis.   Electronically Signed   By: Marlan Palauharles  Clark M.D.   On: 12/29/2013 12:25      Assessment/Plan:  Principal Problem:   Pleural effusion, left Active Problems:   HIV (human immunodeficiency virus infection)   CAP (community acquired pneumonia)    Alexander Patton is a 43 y.o. male with HIV/AIDS with perfect virological control, now with left sided exudative effusion   #1 Exudative effusion:   Agree with workup for TB  (PPD negative and even QF negative DO NOT RULE OUT TB)  ADA levels pending, if we can get IFN gamma test and Gene Expert PCR for TB on fluid would be helpful  HE NEEDS DOCUMENTATION OF INDUCED SPUTUM BY RESPIRATORY EVEN IF SUCH INDUCTION FAILS TO PRODUCE SPUTUM X 3   OK to continue the rocephin but would dc azithro to increase yield on AFB sputa   #2 HIV:  --continue STRIBILD and bactrim    LOS: 2 days   Randall HissCornelius N Van Dam 12/30/2013, 3:46 PM

## 2013-12-30 NOTE — Progress Notes (Addendum)
PULMONARY / CRITICAL CARE MEDICINE Name: Alexander Patton MRN: [id number] DOB: 12/19/1970    ADMISSION DATE:  12/28/2013 CONSULTATION DATE:  12/29/2013  [facility]  [facility]  CHIEF COMPLAINT:  Pleural effusion  BRIEF PATIENT DESCRIPTION: 43 yo HIV+, presented 4/6 with left sided pleuritic chest pain. CXR in ED revealed large left pleural effusion. PCCM asked to see.   SIGNIFICANT EVENTS / STUDIES:   LINES / TUBES:  CULTURES: Blood x2 4/6 >>> Sputum 4/6 >>>  ANTIBIOTICS: Azithromycin 4/6 >>> 4/7 Rocephin 4/6 >>> Bactrim 4/6 >>>  SUBJECTIVE:  Reports some pleuritic chest pain.  VITAL SIGNS: Temp:  [98 F (36.7 C)-98.6 F (37 C)] 98.2 F (36.8 C) (04/08 0534) Pulse Rate:  [79-95] 79 (04/08 0534) Resp:  [17-18] 18 (04/08 0534) BP: (107-115)/(68-71) 115/68 mmHg (04/08 0534) SpO2:  [98 %] 98 % (04/08 0534) Weight:  [151 lb 3.2 oz (68.584 kg)] 151 lb 3.2 oz (68.584 kg) (04/08 0534)  PHYSICAL EXAMINATION: General:  wdwn male, NAD  Neuro:  Alert, oriented HEENT:  [place]/AT, PERRL, no JVD Cardiovascular:  RRR Lungs:  Resps even non labored, Diminished L base  Abdomen:  Soft, non-distended Musculoskeletal:  No acute deformity Skin:  Intact  Recent Labs Lab 12/28/13 2220 12/29/13 0538  NA 135* 136*  K 3.9 3.7  CL 98 101  CO2 23 23  BUN 13 9  CREATININE 0.90 0.75  GLUCOSE 105* 104*    Recent Labs Lab 12/28/13 2220 12/29/13 0538  HGB 13.2 12.3*  HCT 38.1* 35.8*  WBC 8.3 6.7  PLT 261 242   Dg Chest 2 View  12/28/2013   CLINICAL DATA:  Left-sided chest pain.  EXAM: CHEST  2 VIEW  COMPARISON:  None.  FINDINGS: A moderate to large left-sided pleural effusion is noted, with left basilar airspace opacification. This could reflect pneumonia. The right lung appears relatively clear. No pneumothorax is seen.  The heart is mildly enlarged. No acute osseous abnormalities are identified.  IMPRESSION: Moderate to large left-sided pleural effusion, with left  basilar airspace opacification. This could reflect pneumonia. Would perform follow-up chest radiograph after completion of treatment for the acute process, to ensure resolution of underlying airspace opacities and exclude an underlying mass.   Electronically Signed   By: Roanna RaiderJeffery  Chang M.D.   On: 12/28/2013 23:06   Dg Chest Port 1 View  12/29/2013   CLINICAL DATA:  Post left thoracentesis  EXAM: PORTABLE CHEST - 1 VIEW  COMPARISON:  12/28/2013  FINDINGS: Decreased left effusion fall following thoracentesis. Mild to moderate left thoracentesis and left lower lobe atelectasis remain. No pneumothorax. Right lung is clear.  IMPRESSION: No pneumothorax post left thoracentesis.   Electronically Signed   By: Marlan Palauharles  Clark M.D.   On: 12/29/2013 12:25   ASSESSMENT / PLAN:  Large left pleural effusion, s/p thoracentesis, exudative Possible LLL pneumonia Possible pulmonary TB HIV+  Continue Rocephin, Bactrim - ID following   F/u thoracentesis cultures, AFB  F/u CXR   Sputum for AFB pending   Quantiferon gold  May need FOB / BAL if sputum induction is unsuccessful   Bernadene PersonKathryn A Whiteheart, NP 12/30/2013  11:28 AM Pager: [phone] or [phone]  I have personally obtained history, examined patient, evaluated and interpreted laboratory and imaging results, reviewed medical records, formulated assessment / plan and placed orders.  Lonia FarberKonstantin Marcha Licklider, MD Pulmonary and Critical Care Medicine [facility] Pager: [phone]  12/30/2013, 3:57 PM

## 2013-12-31 DIAGNOSIS — A150 Tuberculosis of lung: Secondary | ICD-10-CM

## 2013-12-31 LAB — ADENOSINE DEAMINASE, FLUID: ADENOSINE DEAMINASE FL: 40.1 U/L — AB (ref <7.6)

## 2013-12-31 MED ORDER — PYRAZINAMIDE 500 MG PO TABS
1500.0000 mg | ORAL_TABLET | Freq: Every day | ORAL | Status: DC
  Administered 2014-01-01: 1500 mg via ORAL
  Filled 2013-12-31: qty 3

## 2013-12-31 MED ORDER — ENSURE PUDDING PO PUDG
1.0000 | ORAL | Status: DC
  Administered 2014-01-01: 1 via ORAL

## 2013-12-31 MED ORDER — RIFABUTIN 150 MG PO CAPS
300.0000 mg | ORAL_CAPSULE | Freq: Every day | ORAL | Status: DC
  Administered 2014-01-01: 300 mg via ORAL
  Filled 2013-12-31: qty 2

## 2013-12-31 MED ORDER — SODIUM CHLORIDE 3 % IN NEBU: 3.0000 mL | INHALATION_SOLUTION | Freq: Every day | RESPIRATORY_TRACT | Status: DC

## 2013-12-31 MED ORDER — VITAMIN B-6 50 MG PO TABS
50.0000 mg | ORAL_TABLET | Freq: Every day | ORAL | Status: DC
  Administered 2014-01-01: 50 mg via ORAL
  Filled 2013-12-31: qty 1

## 2013-12-31 MED ORDER — SODIUM CHLORIDE 3 % IN NEBU
3.0000 mL | INHALATION_SOLUTION | Freq: Three times a day (TID) | RESPIRATORY_TRACT | Status: AC
  Administered 2013-12-31: 3 mL via RESPIRATORY_TRACT
  Filled 2013-12-31 (×2): qty 4

## 2013-12-31 MED ORDER — EMTRICITABINE-TENOFOVIR DF 200-300 MG PO TABS
1.0000 | ORAL_TABLET | Freq: Every day | ORAL | Status: DC
  Administered 2013-12-31 – 2014-01-01 (×2): 1 via ORAL
  Filled 2013-12-31 (×2): qty 1

## 2013-12-31 MED ORDER — ENSURE COMPLETE PO LIQD
237.0000 mL | Freq: Two times a day (BID) | ORAL | Status: DC
  Administered 2013-12-31 – 2014-01-01 (×2): 237 mL via ORAL

## 2013-12-31 MED ORDER — DOLUTEGRAVIR SODIUM 50 MG PO TABS
50.0000 mg | ORAL_TABLET | Freq: Every day | ORAL | Status: DC
  Administered 2013-12-31 – 2014-01-01 (×2): 50 mg via ORAL
  Filled 2013-12-31 (×2): qty 1

## 2013-12-31 MED ORDER — SODIUM CHLORIDE 3 % IN NEBU
3.0000 mL | INHALATION_SOLUTION | Freq: Every day | RESPIRATORY_TRACT | Status: DC
  Administered 2013-12-31: 3 mL via RESPIRATORY_TRACT
  Filled 2013-12-31 (×2): qty 4

## 2013-12-31 MED ORDER — ETHAMBUTOL HCL 400 MG PO TABS
1200.0000 mg | ORAL_TABLET | Freq: Every day | ORAL | Status: DC
  Administered 2014-01-01: 1200 mg via ORAL
  Filled 2013-12-31: qty 3

## 2013-12-31 MED ORDER — ADULT MULTIVITAMIN W/MINERALS CH: 1.0000 | ORAL_TABLET | Freq: Every day | ORAL | Status: DC

## 2013-12-31 MED ORDER — ISONIAZID 300 MG PO TABS
300.0000 mg | ORAL_TABLET | Freq: Every day | ORAL | Status: DC
  Administered 2014-01-01: 300 mg via ORAL
  Filled 2013-12-31: qty 1

## 2013-12-31 NOTE — Progress Notes (Signed)
INITIAL NUTRITION ASSESSMENT  DOCUMENTATION CODES Per approved criteria  -Not Applicable   INTERVENTION: Provide Ensure Complete BID Provide Ensure Pudding once daily Provide Multivitamin with minerals daily  NUTRITION DIAGNOSIS: Increased nutrient needs related to HIV as evidenced by pt's chart.   Goal: Pt to meet >/= 90% of their estimated nutrition needs   Monitor:  PO intake, weight, labs  Reason for Assessment: Consult  43 y.o. male  Admitting Dx: Pleural effusion, left  ASSESSMENT: 43 y.o. male with history of HIV being treated [place] and patient states his last CD4 count was 49 2 months ago presents to the ER with complaints of left-sided chest pain. Patient has been having left-sided chest pain last 2 days which increases on deep inspiration and has nonproductive cough. In the ER chest x-ray shows moderate to large left pleural effusion with possible pneumonia.  Per RN pt requesting Ensure. Pt denies weight loss and reports having a good appetite and eating well. Pt states he has been told by his doctor to drink nutritional supplements in between meals. Pt appears thin, would likely benefit nutritional supplements.  Height: Ht Readings from Last 1 Encounters:  12/29/13 5\' 11"  (1.803 m)    Weight: Wt Readings from Last 1 Encounters:  12/31/13 150 lb 14.4 oz (68.448 kg)    Ideal Body Weight: 178 lbs  % Ideal Body Weight: 84%  Wt Readings from Last 10 Encounters:  12/31/13 150 lb 14.4 oz (68.448 kg)  08/28/13 150 lb (68.04 kg)    Usual Body Weight: 150 lbs  % Usual Body Weight: 100%  BMI:  Body mass index is 21.06 kg/(m^2).  Estimated Nutritional Needs: Kcal: 2200-2400 Protein: 90-105 grams Fluid: >/= 2.2 L/day  Skin: dry, intact  Diet Order: General  EDUCATION NEEDS: -No education needs identified at this time   Intake/Output Summary (Last 24 hours) at 12/31/13 1635 Last data filed at 12/31/13 1359  Gross per 24 hour  Intake   5620  ml  Output    850 ml  Net   4770 ml    Last BM: 4/6   Labs:   Recent Labs Lab 12/28/13 2220 12/29/13 0538  NA 135* 136*  K 3.9 3.7  CL 98 101  CO2 23 23  BUN 13 9  CREATININE 0.90 0.75  CALCIUM 8.4 8.3*  GLUCOSE 105* 104*    CBG (last 3)  No results found for this basename: GLUCAP,  in the last 72 hours  Scheduled Meds: . cefTRIAXone (ROCEPHIN)  IV  1 g Intravenous Q24H  . elvitegravir-cobicistat-emtricitabine-tenofovir  1 tablet Oral Q breakfast  . loratadine  10 mg Oral Daily  . sodium chloride  3 mL Intravenous Q12H  . sodium chloride HYPERTONIC  3 mL Nebulization 3 times per day  . sulfamethoxazole-trimethoprim  1 tablet Oral Daily    Continuous Infusions: . sodium chloride 1,000 mL (12/31/13 0745)    Past Medical History  Diagnosis Date  . HIV positive   . Pleural effusion, left 12/29/2013    History reviewed. No pertinent past surgical history.  Ian Malkineanne Barnett RD, LDN Inpatient Clinical Dietitian Pager: [phone] After Hours Pager: [phone]

## 2013-12-31 NOTE — Progress Notes (Signed)
Spoke with patient concerning need for sputum collection and patients states that he has a dry cough nothing comes up. Notified pharmacy to send hypertonic saline so nebulizer could be done. Awaiting hypertonic saline, RN will call when it arrives.

## 2013-12-31 NOTE — Progress Notes (Signed)
[facility]     Subjective: No new complaints   Antibiotics:  Anti-infectives   Start     Dose/Rate Route Frequency Ordered Stop   12/30/13 0200  cefTRIAXone (ROCEPHIN) 1 g in dextrose 5 % 50 mL IVPB     1 g 100 mL/hr over 30 Minutes Intravenous Every 24 hours 12/29/13 0243     12/30/13 0200  azithromycin (ZITHROMAX) 500 mg in dextrose 5 % 250 mL IVPB  Status:  Discontinued     500 mg 250 mL/hr over 60 Minutes Intravenous Every 24 hours 12/29/13 0243 12/29/13 1522   12/29/13 1000  sulfamethoxazole-trimethoprim (BACTRIM DS) 800-160 MG per tablet 1 tablet     1 tablet Oral Daily 12/29/13 0243     12/29/13 0700  elvitegravir-cobicistat-emtricitabine-tenofovir (STRIBILD) 150-150-200-300 MG tablet 1 tablet     1 tablet Oral Daily with breakfast 12/29/13 0243     12/29/13 0030  sulfamethoxazole-trimethoprim (BACTRIM DS) 800-160 MG per tablet 2 tablet     2 tablet Oral  Once 12/29/13 0026 12/29/13 0041   12/29/13 0030  cefTRIAXone (ROCEPHIN) 1 g in dextrose 5 % 50 mL IVPB     1 g 100 mL/hr over 30 Minutes Intravenous  Once 12/29/13 0026 12/29/13 0114   12/29/13 0030  azithromycin (ZITHROMAX) 500 mg in dextrose 5 % 250 mL IVPB     500 mg 250 mL/hr over 60 Minutes Intravenous  Once 12/29/13 0026 12/29/13 0341      Medications: Scheduled Meds: . cefTRIAXone (ROCEPHIN)  IV  1 g Intravenous Q24H  . elvitegravir-cobicistat-emtricitabine-tenofovir  1 tablet Oral Q breakfast  . feeding supplement (ENSURE COMPLETE)  237 mL Oral BID  . [START ON 01/01/2014] feeding supplement (ENSURE)  1 Container Oral Q24H  . loratadine  10 mg Oral Daily  . [START ON 01/01/2014] multivitamin with minerals  1 tablet Oral Daily  . sodium chloride  3 mL Intravenous Q12H  . sodium chloride HYPERTONIC  3 mL Nebulization 3 times per day  . sulfamethoxazole-trimethoprim  1 tablet Oral Daily   Continuous Infusions: . sodium chloride 1,000 mL (12/31/13 0745)   PRN  Meds:.acetaminophen, acetaminophen, morphine injection, ondansetron (ZOFRAN) IV, ondansetron    Objective: Weight change: -4.8 oz (-0.136 kg)  Intake/Output Summary (Last 24 hours) at 12/31/13 1715 Last data filed at 12/31/13 1359  Gross per 24 hour  Intake   5620 ml  Output    850 ml  Net   4770 ml   Blood pressure 111/70, pulse 92, temperature 98.6 F (37 C), temperature source Oral, resp. rate 18, height 5\' 11"  (1.803 m), weight 150 lb 14.4 oz (68.448 kg), SpO2 97.00%. Temp:  [98.4 F (36.9 C)-98.6 F (37 C)] 98.6 F (37 C) (04/09 1359) Pulse Rate:  [88-92] 92 (04/09 1359) Resp:  [18] 18 (04/09 1359) BP: (111-119)/(67-72) 111/70 mmHg (04/09 1359) SpO2:  [95 %-97 %] 97 % (04/09 1359) Weight:  [150 lb 14.4 oz (68.448 kg)] 150 lb 14.4 oz (68.448 kg) (04/09 16100614)  Physical Exam: General: Alert and awake, oriented x3, not in any acute distress.  HEENT: anicteric sclera, pupils reactive to light and accommodation, EOMI, oropharynx clear and without exudate  CVS regular rate, normal r, no murmur rubs or gallops  Chest: Diminished breath sounds in left base  Abdomen: soft nontender, nondistended, normal bowel sounds,  Extremities: no clubbing or edema noted bilaterally  Skin: no rashes  Neuro: nonfocal, strength and sensation intact   CBC:  Recent  Labs Lab 12/28/13 2220 12/29/13 0538  HGB 13.2 12.3*  HCT 38.1* 35.8*  PLT 261 242     BMET  Recent Labs  12/28/13 2220 12/29/13 0538  NA 135* 136*  K 3.9 3.7  CL 98 101  CO2 23 23  GLUCOSE 105* 104*  BUN 13 9  CREATININE 0.90 0.75  CALCIUM 8.4 8.3*     Liver Panel   Recent Labs  12/29/13 0538  PROT 7.8  ALBUMIN 2.5*  AST 16  ALT 11  ALKPHOS 64  BILITOT 0.3       Sedimentation Rate No results found for this basename: ESRSEDRATE,  in the last 72 hours C-Reactive Protein No results found for this basename: CRP,  in the last 72 hours  Micro Results: Recent Results (from the past 240 hour(s))    CULTURE, BLOOD (ROUTINE X 2)     Status: None   Collection Time    12/29/13 12:35 AM      Result Value Ref Range Status   Specimen Description BLOOD RIGHT ANTECUBITAL   Final   Special Requests BOTTLES DRAWN AEROBIC AND ANAEROBIC 10CC EA   Final   Culture  Setup Time     Final   Value: 12/29/2013 04:52     Performed at [facility]   Culture     Final   Value:        BLOOD CULTURE RECEIVED NO GROWTH TO DATE CULTURE WILL BE HELD FOR 5 DAYS BEFORE ISSUING A FINAL NEGATIVE REPORT     Performed at [facility]   Report Status PENDING   Incomplete  CULTURE, BLOOD (ROUTINE X 2)     Status: None   Collection Time    12/29/13 12:38 AM      Result Value Ref Range Status   Specimen Description BLOOD LEFT FOREARM   Final   Special Requests BOTTLES DRAWN AEROBIC AND ANAEROBIC 10CC EA   Final   Culture  Setup Time     Final   Value: 12/29/2013 04:52     Performed at [facility]   Culture     Final   Value:        BLOOD CULTURE RECEIVED NO GROWTH TO DATE CULTURE WILL BE HELD FOR 5 DAYS BEFORE ISSUING A FINAL NEGATIVE REPORT     Performed at [facility]   Report Status PENDING   Incomplete  AFB CULTURE WITH SMEAR     Status: None   Collection Time    12/29/13 12:54 PM      Result Value Ref Range Status   Specimen Description FLUID LEFT PLEURAL   Final   Special Requests NONE   Final   ACID FAST SMEAR     Final   Value: NO ACID FAST BACILLI SEEN     Performed at [facility]   Culture     Final   Value: CULTURE WILL BE EXAMINED FOR 6 WEEKS BEFORE ISSUING A FINAL REPORT     Performed at [facility]   Report Status PENDING   Incomplete  BODY FLUID CULTURE     Status: None   Collection Time    12/29/13 12:54 PM      Result Value Ref Range Status   Specimen Description FLUID LEFT PLEURAL   Final   Special Requests NONE   Final   Gram Stain     Final   Value: WBC PRESENT,BOTH PMN AND MONONUCLEAR     NO  ORGANISMS SEEN     Performed at  [facility]   Culture     Final   Value: NO GROWTH 2 DAYS     Performed at [facility]   Report Status PENDING   Incomplete  FUNGAL STAIN     Status: None   Collection Time    12/29/13 12:54 PM      Result Value Ref Range Status   Specimen Description FLUID FLUID LEFT PLEURAL   Final   Special Requests NONE   Final   Fungal Smear     Final   Value: NO YEAST OR FUNGAL ELEMENTS SEEN     Performed at [facility]   Report Status 12/30/2013 FINAL   Final    Studies/Results: No results found.    Assessment/Plan:  Principal Problem:   Pleural effusion, left Active Problems:   HIV (human immunodeficiency virus infection)   CAP (community acquired pneumonia)    Alexander Patton is a 43 y.o. male with HIV/AIDS with perfect virological control, now with left sided exudative effusion   #1 Exudative effusion:   Agree with workup for TB   ADA IS UP C/W TB, I have discussed with Dr. Kendrick Fries and we feel pts pleural fluid characteristics are very much in category for TB pleural space infection  --we will get two more induced sputum attempts  --we will start Rifabutin, INH, ETH, PYR , b6 tomorrow after we have gotten his sputum attempts done  --I will change his ARV regimen to Tivicay and Truvada to avoid Drug drug interactions with TB drugs   #2 HIV:  --change to Tivicay and Truvada and bactrim    LOS: 3 days   Alexander Patton 12/31/2013, 5:15 PM

## 2013-12-31 NOTE — Progress Notes (Signed)
[facility] PROGRESS NOTE  Alexander RandWilliam Patton ZOX:[id number] DOB: 08/24/1971 DOA: 12/28/2013 PCP: No PCP Per Patient  Assessment/Plan: 1. SOB and pleuritic chest pain: found with large pleural effusion and presumed LLL PNA -result suggesting exudatives effusion -continue rocephin and bactrim -follow cx's -PPD negative; quantiferon pending -AFB from effusion preliminary neg -ADA elevated and with presentation and risk factors highly suspicious for TB -following ID rec's and pulmonary service rec's will start treatment for TB on 01/01/14  2-HIV/AIDS -continue bactrim -plan is to change HIV meds to tivicay and truvada to avoid drug interaction with TB meds. -follow ID rec's   Code Status: Full Family Communication: no family at bedside Disposition Plan: home when medically stable   Consultants:  ID  PCCM   Procedures:  Thoracentesis 12/29/13   Antibiotics:  Rocephin  Bactrim   HPI/Subjective: Afebrile, feeling better; still with some none productive cough (occassional). Patient also with elevated ADA raising concerns for TB.  Objective: Filed Vitals:   12/31/13 0614  BP: 114/67  Pulse: 88  Temp: 98.6 F (37 C)  Resp: 18    Intake/Output Summary (Last 24 hours) at 12/31/13 1248 Last data filed at 12/31/13 [id number]  Gross per 24 hour  Intake   5620 ml  Output   1600 ml  Net   4020 ml   Filed Weights   12/29/13 0120 12/30/13 0534 12/31/13 0614  Weight: 71.4 kg (157 lb 6.5 oz) 68.584 kg (151 lb 3.2 oz) 68.448 kg (150 lb 14.4 oz)    Exam:   General:  Afebrile, feeling better; still with some cough and associated pleuritic pain  Cardiovascular: S1 and S2, no rubs or gallops  Respiratory: scattered rhonchi, no wheezing  Abdomen: soft, NT, NT positive BS  Musculoskeletal: no cyanosis or clubbing  Data Reviewed: Basic Metabolic Panel:  Recent Labs Lab 12/28/13 2220 12/29/13 0538  NA 135* 136*  K 3.9 3.7  CL 98 101  CO2 23 23  GLUCOSE 105* 104*   BUN 13 9  CREATININE 0.90 0.75  CALCIUM 8.4 8.3*   Liver Function Tests:  Recent Labs Lab 12/29/13 0538  AST 16  ALT 11  ALKPHOS 64  BILITOT 0.3  PROT 7.8  ALBUMIN 2.5*   CBC:  Recent Labs Lab 12/28/13 2220 12/29/13 0538  WBC 8.3 6.7  NEUTROABS  --  4.2  HGB 13.2 12.3*  HCT 38.1* 35.8*  MCV 89.4 89.7  PLT 261 242   Cardiac Enzymes:  Recent Labs Lab 12/29/13 0538  TROPONINI <0.30   BNP (last 3 results)  Recent Labs  12/28/13 2210  PROBNP 50.8   CBG: No results found for this basename: GLUCAP,  in the last 168 hours  Recent Results (from the past 240 hour(s))  CULTURE, BLOOD (ROUTINE X 2)     Status: None   Collection Time    12/29/13 12:35 AM      Result Value Ref Range Status   Specimen Description BLOOD RIGHT ANTECUBITAL   Final   Special Requests BOTTLES DRAWN AEROBIC AND ANAEROBIC 10CC EA   Final   Culture  Setup Time     Final   Value: 12/29/2013 04:52     [facility]   Culture     Final   Value:        BLOOD CULTURE RECEIVED NO GROWTH TO DATE CULTURE WILL BE HELD FOR 5 DAYS BEFORE ISSUING A FINAL NEGATIVE REPORT     [facility]   Report  Status PENDING   Incomplete  CULTURE, BLOOD (ROUTINE X 2)     Status: None   Collection Time    12/29/13 12:38 AM      Result Value Ref Range Status   Specimen Description BLOOD LEFT FOREARM   Final   Special Requests BOTTLES DRAWN AEROBIC AND ANAEROBIC 10CC EA   Final   Culture  Setup Time     Final   Value: 12/29/2013 04:52     Performed at [facility]   Culture     Final   Value:        BLOOD CULTURE RECEIVED NO GROWTH TO DATE CULTURE WILL BE HELD FOR 5 DAYS BEFORE ISSUING A FINAL NEGATIVE REPORT     Performed at [facility]   Report Status PENDING   Incomplete  AFB CULTURE WITH SMEAR     Status: None   Collection Time    12/29/13 12:54 PM      Result Value Ref Range Status   Specimen Description FLUID LEFT PLEURAL   Final   Special  Requests NONE   Final   ACID FAST SMEAR     Final   Value: NO ACID FAST BACILLI SEEN     Performed at [facility]   Culture     Final   Value: CULTURE WILL BE EXAMINED FOR 6 WEEKS BEFORE ISSUING A FINAL REPORT     Performed at [facility]   Report Status PENDING   Incomplete  BODY FLUID CULTURE     Status: None   Collection Time    12/29/13 12:54 PM      Result Value Ref Range Status   Specimen Description FLUID LEFT PLEURAL   Final   Special Requests NONE   Final   Gram Stain     Final   Value: WBC PRESENT,BOTH PMN AND MONONUCLEAR     NO ORGANISMS SEEN     Performed at [facility]   Culture     Final   Value: NO GROWTH 2 DAYS     Performed at [facility]   Report Status PENDING   Incomplete  FUNGAL STAIN     Status: None   Collection Time    12/29/13 12:54 PM      Result Value Ref Range Status   Specimen Description FLUID FLUID LEFT PLEURAL   Final   Special Requests NONE   Final   Fungal Smear     Final   Value: NO YEAST OR FUNGAL ELEMENTS SEEN     Performed at [facility]   Report Status 12/30/2013 FINAL   Final     Studies: No results found.  Scheduled Meds: . cefTRIAXone (ROCEPHIN)  IV  1 g Intravenous Q24H  . elvitegravir-cobicistat-emtricitabine-tenofovir  1 tablet Oral Q breakfast  . loratadine  10 mg Oral Daily  . sodium chloride  3 mL Intravenous Q12H  . sodium chloride HYPERTONIC  3 mL Nebulization Daily  . sulfamethoxazole-trimethoprim  1 tablet Oral Daily   Continuous Infusions: . sodium chloride 1,000 mL (12/31/13 0745)    Time spent: >30 minutes    Vassie Loll  [facility] Pager [phone]. If 7PM-7AM, please contact night-coverage at [email or web address], password [name] 12/31/2013, 12:48 PM  LOS: 3 days

## 2013-12-31 NOTE — Progress Notes (Signed)
Spoke with pharmacy regarding sputum induction  By RT. Who stated needed order for q8 hrs.  Pharmacy verifed order in computer for QD until saturday need to be reorder q 8 hrs.  Dr. Gwenlyn PerkingMadera made aware.  Janeann Paisley,RN.

## 2013-12-31 NOTE — Progress Notes (Signed)
Administered 3ml hypertonic saline via neb. Pt not able to expectorate anything. Tolerated well. RN notified.

## 2013-12-31 NOTE — Progress Notes (Signed)
[facility] PCCM  Chart reviewed Difficulty getting sputum Fluid studies and MTB PCR pending current sputum  Please let us know if bronch needed  Otherwise, PCCM off, call if questions  Heber CarolinaBrent McQuaid, MD [facility] Pager: [phone] Cell: [phone] If no response, call [phone]

## 2014-01-01 DIAGNOSIS — A1889 Tuberculosis of other sites: Secondary | ICD-10-CM

## 2014-01-01 LAB — QUANTIFERON TB GOLD ASSAY (BLOOD)
Interferon Gamma Release Assay: NEGATIVE
Mitogen value: 2.58 IU/mL
QUANTIFERON NIL VALUE: 0.05 [IU]/mL
TB Ag value: 0.06 IU/mL
TB Antigen Minus Nil Value: 0.01 IU/mL

## 2014-01-01 MED ORDER — PYRAZINAMIDE 500 MG PO TABS: 1500.0000 mg | ORAL_TABLET | Freq: Every day | ORAL | Status: AC

## 2014-01-01 MED ORDER — RIFABUTIN 150 MG PO CAPS: 300.0000 mg | ORAL_CAPSULE | Freq: Every day | ORAL | Status: AC

## 2014-01-01 MED ORDER — DOLUTEGRAVIR SODIUM 50 MG PO TABS: 50.0000 mg | ORAL_TABLET | Freq: Every day | ORAL | Status: AC

## 2014-01-01 MED ORDER — ISONIAZID 300 MG PO TABS: 300.0000 mg | ORAL_TABLET | Freq: Every day | ORAL | Status: AC

## 2014-01-01 MED ORDER — ETHAMBUTOL HCL 400 MG PO TABS: 1200.0000 mg | ORAL_TABLET | Freq: Every day | ORAL | Status: AC

## 2014-01-01 MED ORDER — EMTRICITABINE-TENOFOVIR DF 200-300 MG PO TABS: 1.0000 | ORAL_TABLET | Freq: Every day | ORAL | Status: AC

## 2014-01-01 MED ORDER — ENSURE COMPLETE PO LIQD: 237.0000 mL | Freq: Two times a day (BID) | ORAL | Status: AC

## 2014-01-01 MED ORDER — PYRIDOXINE HCL 50 MG PO TABS: 50.0000 mg | ORAL_TABLET | Freq: Every day | ORAL | Status: AC

## 2014-01-01 NOTE — Discharge Summary (Signed)
Physician Discharge Summary  Anthem Frazer ZOX:[id number] DOB: Feb 01, 1971 DOA: 12/28/2013  PCP: No PCP Per Patient  Admit date: 12/28/2013 Discharge date: 01/01/2014  Time spent: >30 minutes  Recommendations for Outpatient Follow-up:  1. LFT's periodically givent tx with isoniazide   Discharge Diagnoses:  Principal Problem:   Pleural effusion, left Active Problems:   HIV (human immunodeficiency virus infection)   CAP (community acquired pneumonia) Mild protein calorie malnutrition  Discharge Condition: stable and improved. Will follow with Dr. Uvaldo Rising 1-2 weeks.  Diet recommendation: regular diet and ensure TID  Filed Weights   12/30/13 0534 12/31/13 0614 01/01/14 0700  Weight: 68.584 kg (151 lb 3.2 oz) 68.448 kg (150 lb 14.4 oz) 68.4 kg (150 lb 12.7 oz)    History of present illness:  43 y.o. male with history of HIV being treated [place] and patient states his last CD4 count was 49 2 months ago presents to the ER with complaints of left-sided chest pain. Patient has been having left-sided chest pain last 2 days which increases on deep inspiration and has nonproductive cough. Denies any fever chills night sweats. Denies any recent travel outside [place]. Patient states he has been taking his HIV medications regularly. In the ER chest x-ray shows moderate to large left pleural effusion with possible pneumonia. Patient has been empirically started antibiotics. Patient denies any nausea vomiting abdominal pain diarrhea.    Hospital Course:  1-SOB and pleuritic chest pain: found with large pleural effusion and presumed LLL PNA -result suggesting exudatives effusion and high concerns for TB -patient treated for 3 days with IV rocephin for PNA and subsequent transition to treatment for TB as recommended by infectious disease. -will follow final cx's from pleural effussion  -PPD negative; quantiferon Gold negative -AFB from effusion preliminarily neg  -ADA elevated and with  presentation of organized effusion on CXR and his risk factors highly suspicious for TB  -following ID rec's and pulmonary service rec's will start treatment for TB on 01/01/14. Patient will received meds for TB by outpatient pharmacy over the weekend adn then health department will start TB program on Monday. -regimen will be (Rifabutin, INH, ETH, PYR , B6) -patient will follow with ID physician in 1-2 weeks  2-HIV/AIDS  -continue bactrim for prophylaxis -plan is to change HIV meds to tivicay and truvada to avoid drug interaction with TB meds.  -follow with ID in 1-2 weeks  3-protein calorie malnutrition (mild) -increase nutrients intake needed due to TB and HIV -started on ensure TID  Procedures: Left thoracentesis 12/29/13  Consultations:  ID  PCMM  Discharge Exam: Filed Vitals:   01/01/14 1313  BP: 111/71  Pulse: 91  Temp: 98.7 F (37.1 C)  Resp: 20   General: Afebrile, feeling better; still with some cough and associated pleuritic pain  Cardiovascular: S1 and S2, no rubs or gallops  Respiratory: scattered rhonchi, no wheezing  Abdomen: soft, NT, NT positive BS  Musculoskeletal: no cyanosis or clubbing  Discharge Instructions You were cared for by a hospitalist during your hospital stay. If you have any questions about your discharge medications or the care you received while you were in the hospital after you are discharged, you can call the unit and asked to speak with the hospitalist on call if the hospitalist that took care of you is not available. Once you are discharged, your primary care physician will handle any further medical issues. Please note that NO REFILLS for any discharge medications will be authorized once you are discharged, as  it is imperative that you return to your primary care physician (or establish a relationship with a primary care physician if you do not have one) for your aftercare needs so that they can reassess your need for medications and  monitor your lab values.  Discharge Orders   Future Orders Complete By Expires   Discharge instructions  As directed        Medication List    STOP taking these medications       STRIBILD 150-150-200-300 MG Tabs tablet  Generic drug:  elvitegravir-cobicistat-emtricitabine-tenofovir      TAKE these medications       dolutegravir 50 MG tablet  Commonly known as:  TIVICAY  Take 1 tablet (50 mg total) by mouth daily.     emtricitabine-tenofovir 200-300 MG per tablet  Commonly known as:  TRUVADA  Take 1 tablet by mouth daily.     ethambutol 400 MG tablet  Commonly known as:  MYAMBUTOL  Take 3 tablets (1,200 mg total) by mouth daily.     feeding supplement (ENSURE COMPLETE) Liqd  Take 237 mLs by mouth 2 (two) times daily at 10 AM and 5 PM.     isoniazid 300 MG tablet  Commonly known as:  NYDRAZID  Take 1 tablet (300 mg total) by mouth daily.     loratadine 10 MG tablet  Commonly known as:  CLARITIN  Take 10 mg by mouth daily.     naproxen 500 MG tablet  Commonly known as:  NAPROSYN  Take 500 mg by mouth 2 (two) times daily with a meal.     pyrazinamide 500 MG tablet  Take 3 tablets (1,500 mg total) by mouth daily.     pyridOXINE 50 MG tablet  Commonly known as:  B-6  Take 1 tablet (50 mg total) by mouth daily.     rifabutin 150 MG capsule  Commonly known as:  MYCOBUTIN  Take 2 capsules (300 mg total) by mouth daily.     sulfamethoxazole-trimethoprim 800-160 MG per tablet  Commonly known as:  BACTRIM DS  Take 1 tablet by mouth once. For 30 days       Allergies  Allergen Reactions  . Miconazole-Zinc Oxide-Petrolat Hives      The results of significant diagnostics from this hospitalization (including imaging, microbiology, ancillary and laboratory) are listed below for reference.    Significant Diagnostic Studies: Dg Chest 2 View  12/28/2013   CLINICAL DATA:  Left-sided chest pain.  EXAM: CHEST  2 VIEW  COMPARISON:  None.  FINDINGS: A moderate to large  left-sided pleural effusion is noted, with left basilar airspace opacification. This could reflect pneumonia. The right lung appears relatively clear. No pneumothorax is seen.  The heart is mildly enlarged. No acute osseous abnormalities are identified.  IMPRESSION: Moderate to large left-sided pleural effusion, with left basilar airspace opacification. This could reflect pneumonia. Would perform follow-up chest radiograph after completion of treatment for the acute process, to ensure resolution of underlying airspace opacities and exclude an underlying mass.   Electronically Signed   By: Roanna RaiderJeffery  Chang M.D.   On: 12/28/2013 23:06   Dg Chest Port 1 View  12/29/2013   CLINICAL DATA:  Post left thoracentesis  EXAM: PORTABLE CHEST - 1 VIEW  COMPARISON:  12/28/2013  FINDINGS: Decreased left effusion fall following thoracentesis. Mild to moderate left thoracentesis and left lower lobe atelectasis remain. No pneumothorax. Right lung is clear.  IMPRESSION: No pneumothorax post left thoracentesis.   Electronically Signed   By:  Marlan Palau M.D.   On: 12/29/2013 12:25    Microbiology: Recent Results (from the past 240 hour(s))  CULTURE, BLOOD (ROUTINE X 2)     Status: None   Collection Time    12/29/13 12:35 AM      Result Value Ref Range Status   Specimen Description BLOOD RIGHT ANTECUBITAL   Final   Special Requests BOTTLES DRAWN AEROBIC AND ANAEROBIC 10CC EA   Final   Culture  Setup Time     Final   Value: 12/29/2013 04:52     Performed at [facility]   Culture     Final   Value:        BLOOD CULTURE RECEIVED NO GROWTH TO DATE CULTURE WILL BE HELD FOR 5 DAYS BEFORE ISSUING A FINAL NEGATIVE REPORT     Performed at [facility]   Report Status PENDING   Incomplete  CULTURE, BLOOD (ROUTINE X 2)     Status: None   Collection Time    12/29/13 12:38 AM      Result Value Ref Range Status   Specimen Description BLOOD LEFT FOREARM   Final   Special Requests BOTTLES DRAWN AEROBIC AND  ANAEROBIC 10CC EA   Final   Culture  Setup Time     Final   Value: 12/29/2013 04:52     Performed at [facility]   Culture     Final   Value:        BLOOD CULTURE RECEIVED NO GROWTH TO DATE CULTURE WILL BE HELD FOR 5 DAYS BEFORE ISSUING A FINAL NEGATIVE REPORT     Performed at [facility]   Report Status PENDING   Incomplete  AFB CULTURE WITH SMEAR     Status: None   Collection Time    12/29/13 12:54 PM      Result Value Ref Range Status   Specimen Description FLUID LEFT PLEURAL   Final   Special Requests NONE   Final   ACID FAST SMEAR     Final   Value: NO ACID FAST BACILLI SEEN     Performed at [facility]   Culture     Final   Value: CULTURE WILL BE EXAMINED FOR 6 WEEKS BEFORE ISSUING A FINAL REPORT     Performed at [facility]   Report Status PENDING   Incomplete  BODY FLUID CULTURE     Status: None   Collection Time    12/29/13 12:54 PM      Result Value Ref Range Status   Specimen Description FLUID LEFT PLEURAL   Final   Special Requests NONE   Final   Gram Stain     Final   Value: WBC PRESENT,BOTH PMN AND MONONUCLEAR     NO ORGANISMS SEEN     Performed at [facility]   Culture     Final   Value: NO GROWTH 3 DAYS     Performed at [facility]   Report Status PENDING   Incomplete  FUNGAL STAIN     Status: None   Collection Time    12/29/13 12:54 PM      Result Value Ref Range Status   Specimen Description FLUID FLUID LEFT PLEURAL   Final   Special Requests NONE   Final   Fungal Smear     Final   Value: NO YEAST OR FUNGAL ELEMENTS SEEN     Performed at [facility]   Report  Status 12/30/2013 FINAL   Final     Labs: Basic Metabolic Panel:  Recent Labs Lab 12/28/13 2220 12/29/13 0538  NA 135* 136*  K 3.9 3.7  CL 98 101  CO2 23 23  GLUCOSE 105* 104*  BUN 13 9  CREATININE 0.90 0.75  CALCIUM 8.4 8.3*   Liver Function Tests:  Recent Labs Lab 12/29/13 0538  AST 16  ALT 11  ALKPHOS 64   BILITOT 0.3  PROT 7.8  ALBUMIN 2.5*   CBC:  Recent Labs Lab 12/28/13 2220 12/29/13 0538  WBC 8.3 6.7  NEUTROABS  --  4.2  HGB 13.2 12.3*  HCT 38.1* 35.8*  MCV 89.4 89.7  PLT 261 242   Cardiac Enzymes:  Recent Labs Lab 12/29/13 0538  TROPONINI <0.30   BNP: BNP (last 3 results)  Recent Labs  12/28/13 2210  PROBNP 50.8    Signed:  Vassie Loll  [facility] 01/01/2014, 4:54 PM

## 2014-01-01 NOTE — Progress Notes (Signed)
Pt is being discharged home. Pt has been provided with discharge instructions. RN went over instructions with the patient and answered all questions the patient had  

## 2014-01-01 NOTE — Progress Notes (Signed)
[facility]     Subjective: No new complaints   Antibiotics:  Anti-infectives   Start     Dose/Rate Route Frequency Ordered Stop   01/01/14 1100  pyrazinamide tablet 1,500 mg     1,500 mg Oral Daily 12/31/13 1750     01/01/14 1100  isoniazid (NYDRAZID) tablet 300 mg     300 mg Oral Daily 12/31/13 1750     01/01/14 1100  rifabutin (MYCOBUTIN) capsule 300 mg     300 mg Oral Daily 12/31/13 1750     01/01/14 1100  ethambutol (MYAMBUTOL) tablet 1,200 mg     1,200 mg Oral Daily 12/31/13 1750     12/31/13 1800  dolutegravir (TIVICAY) tablet 50 mg     50 mg Oral Daily 12/31/13 1750     12/31/13 1800  emtricitabine-tenofovir (TRUVADA) 200-300 MG per tablet 1 tablet     1 tablet Oral Daily 12/31/13 1750     12/30/13 0200  cefTRIAXone (ROCEPHIN) 1 g in dextrose 5 % 50 mL IVPB  Status:  Discontinued     1 g 100 mL/hr over 30 Minutes Intravenous Every 24 hours 12/29/13 0243 12/31/13 1751   12/30/13 0200  azithromycin (ZITHROMAX) 500 mg in dextrose 5 % 250 mL IVPB  Status:  Discontinued     500 mg 250 mL/hr over 60 Minutes Intravenous Every 24 hours 12/29/13 0243 12/29/13 1522   12/29/13 1000  sulfamethoxazole-trimethoprim (BACTRIM DS) 800-160 MG per tablet 1 tablet     1 tablet Oral Daily 12/29/13 0243     12/29/13 0700  elvitegravir-cobicistat-emtricitabine-tenofovir (STRIBILD) 150-150-200-300 MG tablet 1 tablet  Status:  Discontinued     1 tablet Oral Daily with breakfast 12/29/13 0243 12/31/13 1750   12/29/13 0030  sulfamethoxazole-trimethoprim (BACTRIM DS) 800-160 MG per tablet 2 tablet     2 tablet Oral  Once 12/29/13 0026 12/29/13 0041   12/29/13 0030  cefTRIAXone (ROCEPHIN) 1 g in dextrose 5 % 50 mL IVPB     1 g 100 mL/hr over 30 Minutes Intravenous  Once 12/29/13 0026 12/29/13 0114   12/29/13 0030  azithromycin (ZITHROMAX) 500 mg in dextrose 5 % 250 mL IVPB     500 mg 250 mL/hr over 60 Minutes Intravenous  Once 12/29/13 0026 12/29/13 0341       Medications: Scheduled Meds: . dolutegravir  50 mg Oral Daily  . emtricitabine-tenofovir  1 tablet Oral Daily  . ethambutol  1,200 mg Oral Daily  . feeding supplement (ENSURE COMPLETE)  237 mL Oral BID  . feeding supplement (ENSURE)  1 Container Oral Q24H  . isoniazid  300 mg Oral Daily  . loratadine  10 mg Oral Daily  . pyrazinamide  1,500 mg Oral Daily  . vitamin B-6  50 mg Oral Daily  . rifabutin  300 mg Oral Daily  . sodium chloride  3 mL Intravenous Q12H  . sulfamethoxazole-trimethoprim  1 tablet Oral Daily   Continuous Infusions:   PRN Meds:.acetaminophen, acetaminophen, morphine injection, ondansetron (ZOFRAN) IV, ondansetron    Objective: Weight change: -1.7 oz (-0.048 kg)  Intake/Output Summary (Last 24 hours) at 01/01/14 1423 Last data filed at 01/01/14 1313  Gross per 24 hour  Intake 1785.33 ml  Output    525 ml  Net 1260.33 ml   Blood pressure 111/71, pulse 91, temperature 98.7 F (37.1 C), temperature source Oral, resp. rate 20, height 5\' 11"  (1.803 m), weight 150 lb 12.7 oz (68.4 kg), SpO2 96.00%. Temp:  [  98 F (36.7 C)-98.7 F (37.1 C)] 98.7 F (37.1 C) (04/10 1313) Pulse Rate:  [82-91] 91 (04/10 1313) Resp:  [18-20] 20 (04/10 1313) BP: (111-114)/(64-71) 111/71 mmHg (04/10 1313) SpO2:  [96 %] 96 % (04/10 1313) Weight:  [150 lb 12.7 oz (68.4 kg)] 150 lb 12.7 oz (68.4 kg) (04/10 0700)  Physical Exam: General: Alert and awake, oriented x3, not in any acute distress.  HEENT: anicteric sclera, pupils reactive to light and accommodation, EOMI, oropharynx clear and without exudate  CVS regular rate, normal r, no murmur rubs or gallops  Chest: Diminished breath sounds in left base  Abdomen: soft nontender, nondistended, normal bowel sounds,  Extremities: no clubbing or edema noted bilaterally  Skin: no rashes  Neuro: nonfocal, strength and sensation intact   CBC:  Recent Labs Lab 12/28/13 2220 12/29/13 0538  HGB 13.2 12.3*  HCT 38.1* 35.8*   PLT 261 242     BMET No results found for this basename: NA, K, CL, CO2, GLUCOSE, BUN, CREATININE, CALCIUM,  in the last 72 hours   Liver Panel  No results found for this basename: PROT, ALBUMIN, AST, ALT, ALKPHOS, BILITOT, BILIDIR, IBILI,  in the last 72 hours     Sedimentation Rate No results found for this basename: ESRSEDRATE,  in the last 72 hours C-Reactive Protein No results found for this basename: CRP,  in the last 72 hours  Micro Results: Recent Results (from the past 240 hour(s))  CULTURE, BLOOD (ROUTINE X 2)     Status: None   Collection Time    12/29/13 12:35 AM      Result Value Ref Range Status   Specimen Description BLOOD RIGHT ANTECUBITAL   Final   Special Requests BOTTLES DRAWN AEROBIC AND ANAEROBIC 10CC EA   Final   Culture  Setup Time     Final   Value: 12/29/2013 04:52     Performed at [facility]   Culture     Final   Value:        BLOOD CULTURE RECEIVED NO GROWTH TO DATE CULTURE WILL BE HELD FOR 5 DAYS BEFORE ISSUING A FINAL NEGATIVE REPORT     Performed at [facility]   Report Status PENDING   Incomplete  CULTURE, BLOOD (ROUTINE X 2)     Status: None   Collection Time    12/29/13 12:38 AM      Result Value Ref Range Status   Specimen Description BLOOD LEFT FOREARM   Final   Special Requests BOTTLES DRAWN AEROBIC AND ANAEROBIC 10CC EA   Final   Culture  Setup Time     Final   Value: 12/29/2013 04:52     Performed at [facility]   Culture     Final   Value:        BLOOD CULTURE RECEIVED NO GROWTH TO DATE CULTURE WILL BE HELD FOR 5 DAYS BEFORE ISSUING A FINAL NEGATIVE REPORT     Performed at [facility]   Report Status PENDING   Incomplete  AFB CULTURE WITH SMEAR     Status: None   Collection Time    12/29/13 12:54 PM      Result Value Ref Range Status   Specimen Description FLUID LEFT PLEURAL   Final   Special Requests NONE   Final   ACID FAST SMEAR     Final   Value: NO ACID FAST BACILLI SEEN      Performed at [facility]  Culture     Final   Value: CULTURE WILL BE EXAMINED FOR 6 WEEKS BEFORE ISSUING A FINAL REPORT     Performed at [facility]   Report Status PENDING   Incomplete  BODY FLUID CULTURE     Status: None   Collection Time    12/29/13 12:54 PM      Result Value Ref Range Status   Specimen Description FLUID LEFT PLEURAL   Final   Special Requests NONE   Final   Gram Stain     Final   Value: WBC PRESENT,BOTH PMN AND MONONUCLEAR     NO ORGANISMS SEEN     Performed at [facility]   Culture     Final   Value: NO GROWTH 3 DAYS     Performed at [facility]   Report Status PENDING   Incomplete  FUNGAL STAIN     Status: None   Collection Time    12/29/13 12:54 PM      Result Value Ref Range Status   Specimen Description FLUID FLUID LEFT PLEURAL   Final   Special Requests NONE   Final   Fungal Smear     Final   Value: NO YEAST OR FUNGAL ELEMENTS SEEN     Performed at [facility]   Report Status 12/30/2013 FINAL   Final    Studies/Results: No results found.    Assessment/Plan:  Principal Problem:   Pleural effusion, left Active Problems:   HIV (human immunodeficiency virus infection)   CAP (community acquired pneumonia)    Alexander Patton is a 43 y.o. male with HIV/AIDS with perfect virological control, now with left sided exudative effusion   #1 Exudative effusion:   Agree with workup for TB   ADA IS UP C/W TB, I have discussed with Dr. Kendrick Fries and we feel pts pleural fluid characteristics are very much in category for TB pleural space infectio  I have also discuseed with Dr. Lamar Benes from [facility] and will try again to get in touch with Dr. Uvaldo Rising at [facility] re changes to ARVS that I have made  He has gotten three attempts and we have started:  - Rifabutin, INH, ETH, PYR , b6  --I have talked to Lamar Benes MD from [facility] and asked Case management to alert [facility] so that they may bring him meds for  treatment of extra pulmonary TB  --I have changed his ARV regimen to Tivicay and Truvada to avoid Drug drug interactions with TB drugs --I have personally phoned in this new regimen to [facility] [employer] [facility] [employer] and meds should be ready for pickup at [facility] for HIV  I spent greater than 60 minutes with the patient including greater than 50% of time in face to face counsel of the patient and in coordination of their care.  --will see if lab can add MTB PCR to fluid from pleural space  #2 HIV:  --changed to Tivicay and Truvada and bactrim and will alert his HIV MD in [facility] --he should followup with Dr. Uvaldo Rising in next 1-2 weeks   LOS: 4 days   Randall Hiss 01/01/2014, 2:23 PM

## 2014-01-01 NOTE — Progress Notes (Signed)
Utilization Review Completed Acire Tang J. Kimiko Common, RN, BSN, NCM [phone]  

## 2014-01-01 NOTE — Progress Notes (Signed)
12/31/13 2345  Aerosol Therapy Tx  Medications Other (Comment) (Hypertonic saline)  Solution Hypertonic saline  Delivery Source Oxygen  Delivery Device HHN  Pre-Treatment Pulse 89  Pre-Treatment Respirations 16  Treatment Tolerance Tolerated well  Treatment Given 1  RT Breath Sounds  Bilateral Breath Sounds Clear;Diminished  Oxygen Therapy/Pulse Ox  O2 Device None (Room air)  O2 Therapy Room air  RT attempted sputum induction with Hypertonic saline per MD order. Patient had a good , strong  But dry cough. He did not bring any secretions up. He said he will keep trying to cough. He has three cups in the room to spit the sample in. Janece CanterburyFrancis Soma Bachand RRT.

## 2014-01-02 LAB — BODY FLUID CULTURE: CULTURE: NO GROWTH

## 2014-01-04 LAB — CULTURE, BLOOD (ROUTINE X 2)
CULTURE: NO GROWTH
Culture: NO GROWTH

## 2014-01-08 LAB — M. TUBERCULOSIS COMPLEX BY PCR: M. tuberculosis, Direct: NOT DETECTED

## 2014-02-10 LAB — AFB CULTURE WITH SMEAR (NOT AT ARMC): Acid Fast Smear: NONE SEEN

## 2014-03-12 ENCOUNTER — Ambulatory Visit
Admission: RE | Admit: 2014-03-12 | Discharge: 2014-03-12 | Disposition: A | Discharge status: Home / Self Care | Payer: No Typology Code available for payment source | Source: Ambulatory Visit | Attending: Infectious Disease | Admitting: Infectious Disease

## 2014-03-12 ENCOUNTER — Other Ambulatory Visit: Payer: Self-pay | Admitting: Infectious Disease

## 2014-03-12 DIAGNOSIS — Z9289 Personal history of other medical treatment: Secondary | ICD-10-CM

## 2014-03-25 ENCOUNTER — Encounter (HOSPITAL_COMMUNITY): Payer: Self-pay | Admitting: Emergency Medicine

## 2014-03-25 ENCOUNTER — Emergency Department (HOSPITAL_COMMUNITY)
Admission: EM | Admit: 2014-03-25 | Discharge: 2014-03-25 | Disposition: A | Discharge status: Home / Self Care | Payer: Medicaid Other | Attending: Emergency Medicine | Admitting: Emergency Medicine

## 2014-03-25 DIAGNOSIS — Z21 Asymptomatic human immunodeficiency virus [HIV] infection status: Secondary | ICD-10-CM | POA: Diagnosis not present

## 2014-03-25 DIAGNOSIS — L84 Corns and callosities: Secondary | ICD-10-CM

## 2014-03-25 DIAGNOSIS — Z79899 Other long term (current) drug therapy: Secondary | ICD-10-CM | POA: Insufficient documentation

## 2014-03-25 DIAGNOSIS — Z791 Long term (current) use of non-steroidal anti-inflammatories (NSAID): Secondary | ICD-10-CM | POA: Insufficient documentation

## 2014-03-25 DIAGNOSIS — Z8709 Personal history of other diseases of the respiratory system: Secondary | ICD-10-CM | POA: Insufficient documentation

## 2014-03-25 DIAGNOSIS — F17200 Nicotine dependence, unspecified, uncomplicated: Secondary | ICD-10-CM | POA: Diagnosis not present

## 2014-03-25 DIAGNOSIS — Z792 Long term (current) use of antibiotics: Secondary | ICD-10-CM | POA: Insufficient documentation

## 2014-03-25 DIAGNOSIS — M79609 Pain in unspecified limb: Secondary | ICD-10-CM | POA: Diagnosis present

## 2014-03-25 MED ORDER — TRAMADOL HCL 50 MG PO TABS: 50.0000 mg | ORAL_TABLET | Freq: Four times a day (QID) | ORAL | Status: AC | PRN

## 2014-03-25 NOTE — ED Notes (Signed)
PA-C at beside

## 2014-03-25 NOTE — Discharge Instructions (Signed)
Take the prescribed medication as directed. Follow-up with wound care center next week as previously scheduled. Return to the ED for new or worsening symptoms.

## 2014-03-25 NOTE — ED Provider Notes (Signed)
CSN: [id number]     Arrival date & time 03/25/14  2148 History   First MD Initiated Contact with Patient 03/25/14 2303     Chief Complaint  Patient presents with  . Foot Pain     (Consider location/radiation/quality/duration/timing/severity/associated sxs/prior Treatment) Patient is a 43 y.o. male presenting with lower extremity pain. The history is provided by the patient and medical records.  Foot Pain  This is a 43 year old male with past medical history significant for HIV, presenting to the ED for right foot pain. Patient states he has a callus on the ball of his right foot which has been present for the past several months. Patient states he has previously had this shaved down at the wound clinic and is due to have this done again next week.  Patient states that he walks a lot throughout the day, which he thinks is making his pain worse. He denies any injury, trauma, or falls of right foot. He denies any difficulty ambulating. He denies any numbness, paresthesias, or weakness.  Pt requests different shoe to wear to help alleviate pain.  Past Medical History  Diagnosis Date  . HIV positive   . Pleural effusion, left 12/29/2013   History reviewed. No pertinent past surgical history. Family History  Problem Relation Age of Onset  . Stroke Mother    History  Substance Use Topics  . Smoking status: Current Every Day Smoker -- 0.00 packs/day    Types: Cigarettes  . Smokeless tobacco: Not on file  . Alcohol Use: Yes    Review of Systems  Musculoskeletal:       Callus right foot  All other systems reviewed and are negative.     Allergies  Miconazole-zinc oxide-petrolat  Home Medications   Prior to Admission medications   Medication Sig Start Date End Date Taking? Authorizing Provider  dolutegravir (TIVICAY) 50 MG tablet Take 1 tablet (50 mg total) by mouth daily. 01/01/14   Vassie Lollarlos Madera, MD  emtricitabine-tenofovir (TRUVADA) 200-300 MG per tablet Take 1 tablet by mouth  daily. 01/01/14   Vassie Lollarlos Madera, MD  ethambutol (MYAMBUTOL) 400 MG tablet Take 3 tablets (1,200 mg total) by mouth daily. 01/01/14   Vassie Lollarlos Madera, MD  feeding supplement, ENSURE COMPLETE, (ENSURE COMPLETE) LIQD Take 237 mLs by mouth 2 (two) times daily at 10 AM and 5 PM. 01/01/14   Vassie Lollarlos Madera, MD  isoniazid (NYDRAZID) 300 MG tablet Take 1 tablet (300 mg total) by mouth daily. 01/01/14   Vassie Lollarlos Madera, MD  loratadine (CLARITIN) 10 MG tablet Take 10 mg by mouth daily.    Historical Provider, MD  naproxen (NAPROSYN) 500 MG tablet Take 500 mg by mouth 2 (two) times daily with a meal.    Historical Provider, MD  pyrazinamide 500 MG tablet Take 3 tablets (1,500 mg total) by mouth daily. 01/01/14   Vassie Lollarlos Madera, MD  pyridOXINE (B-6) 50 MG tablet Take 1 tablet (50 mg total) by mouth daily. 01/01/14   Vassie Lollarlos Madera, MD  rifabutin (MYCOBUTIN) 150 MG capsule Take 2 capsules (300 mg total) by mouth daily. 01/01/14   Vassie Lollarlos Madera, MD  sulfamethoxazole-trimethoprim (BACTRIM DS) 800-160 MG per tablet Take 1 tablet by mouth once. For 30 days 11/27/13   Historical Provider, MD   BP 132/79  Pulse 75  Temp(Src) 98.9 F (37.2 C) (Oral)  Resp 22  Ht 5\' 9"  (1.753 m)  Wt 160 lb (72.576 kg)  BMI 23.62 kg/m2  SpO2 100%  Physical Exam  Nursing note and vitals reviewed.  Constitutional: He is oriented to person, place, and time. He appears well-developed and well-nourished. No distress.  HENT:  Head: Normocephalic and atraumatic.  Mouth/Throat: Oropharynx is clear and moist.  Eyes: Conjunctivae and EOM are normal. Pupils are equal, round, and reactive to light.  Neck: Normal range of motion. Neck supple.  Cardiovascular: Normal rate, regular rhythm and normal heart sounds.   Pulmonary/Chest: Effort normal and breath sounds normal. No respiratory distress. He has no wheezes.  Musculoskeletal: Normal range of motion.  Right foot with small callus on ball of foot; no surrounding erythema, induration, or signs of  cellulitis; range of motion of foot, ankle, and all toes; NVI  Neurological: He is alert and oriented to person, place, and time.  Skin: Skin is warm and dry. He is not diaphoretic.  Psychiatric: He has a normal mood and affect.    ED Course  Procedures (including critical care time) Labs Review Labs Reviewed - No data to display  Imaging Review No results found.   EKG Interpretation None      MDM   Final diagnoses:  Callus of foot   Right foot pain secondary to small callus on ball of foot without acute infectious process. Patient has previously scheduled followup with wound care center next week for debridement. Short supply of pain medication given.  Postop shoe given at his request.  Discussed plan with patient, he/she acknowledged understanding and agreed with plan of care.  Return precautions given for new or worsening symptoms.  Garlon HatchetLisa M Hikeem Andersson, PA-C 03/25/14 2330

## 2014-03-25 NOTE — ED Notes (Signed)
Pt. reports recurring callus at right bottom  foot for several months , pt. stated he walks a lot .

## 2014-04-05 NOTE — ED Provider Notes (Signed)
Medical screening examination/treatment/procedure(s) were performed by non-physician practitioner and as supervising physician I was immediately available for consultation/collaboration.   EKG Interpretation None        Brandt LoosenJulie Manly, MD 04/05/14 [phone]

## 2014-07-09 ENCOUNTER — Ambulatory Visit
Admission: RE | Admit: 2014-07-09 | Discharge: 2014-07-09 | Disposition: A | Discharge status: Home / Self Care | Payer: No Typology Code available for payment source | Source: Ambulatory Visit | Attending: Infectious Disease | Admitting: Infectious Disease

## 2014-07-09 ENCOUNTER — Other Ambulatory Visit: Payer: Self-pay | Admitting: Infectious Disease

## 2014-07-09 DIAGNOSIS — A159 Respiratory tuberculosis unspecified: Secondary | ICD-10-CM

## 2014-08-12 ENCOUNTER — Ambulatory Visit: Payer: Medicaid Other

## 2014-09-07 ENCOUNTER — Ambulatory Visit: Payer: Medicaid Other

## 2014-09-23 ENCOUNTER — Encounter (HOSPITAL_COMMUNITY): Payer: Self-pay | Admitting: Family Medicine

## 2014-09-23 ENCOUNTER — Emergency Department (HOSPITAL_COMMUNITY): Payer: Medicaid Other

## 2014-09-23 ENCOUNTER — Emergency Department (HOSPITAL_COMMUNITY)
Admission: EM | Admit: 2014-09-23 | Discharge: 2014-09-23 | Disposition: A | Discharge status: Home / Self Care | Payer: Medicaid Other | Attending: Emergency Medicine | Admitting: Emergency Medicine

## 2014-09-23 DIAGNOSIS — J069 Acute upper respiratory infection, unspecified: Secondary | ICD-10-CM

## 2014-09-23 DIAGNOSIS — R059 Cough, unspecified: Secondary | ICD-10-CM

## 2014-09-23 DIAGNOSIS — Z21 Asymptomatic human immunodeficiency virus [HIV] infection status: Secondary | ICD-10-CM | POA: Diagnosis not present

## 2014-09-23 DIAGNOSIS — Z79899 Other long term (current) drug therapy: Secondary | ICD-10-CM | POA: Insufficient documentation

## 2014-09-23 DIAGNOSIS — Z720 Tobacco use: Secondary | ICD-10-CM | POA: Insufficient documentation

## 2014-09-23 DIAGNOSIS — R05 Cough: Secondary | ICD-10-CM

## 2014-09-23 MED ORDER — SALINE SPRAY 0.65 % NA SOLN: 1.0000 | NASAL | Status: AC | PRN

## 2014-09-23 NOTE — Discharge Instructions (Signed)
Follow-up with your infectious disease doctor. Use over-the-counter medications for symptomatic relief. Return to the ER with any high fever, shortness of breath, chest pain, worsening of symptoms.   Upper Respiratory Infection, Adult An upper respiratory infection (URI) is also sometimes known as the common cold. The upper respiratory tract includes the nose, sinuses, throat, trachea, and bronchi. Bronchi are the airways leading to the lungs. Most people improve within 1 week, but symptoms can last up to 2 weeks. A residual cough may last even longer.  CAUSES Many different viruses can infect the tissues lining the upper respiratory tract. The tissues become irritated and inflamed and often become very moist. Mucus production is also common. A cold is contagious. You can easily spread the virus to others by oral contact. This includes kissing, sharing a glass, coughing, or sneezing. Touching your mouth or nose and then touching a surface, which is then touched by another person, can also spread the virus. SYMPTOMS  Symptoms typically develop 1 to 3 days after you come in contact with a cold virus. Symptoms vary from person to person. They may include:  Runny nose.  Sneezing.  Nasal congestion.  Sinus irritation.  Sore throat.  Loss of voice (laryngitis).  Cough.  Fatigue.  Muscle aches.  Loss of appetite.  Headache.  Low-grade fever. DIAGNOSIS  You might diagnose your own cold based on familiar symptoms, since most people get a cold 2 to 3 times a year. Your caregiver can confirm this based on your exam. Most importantly, your caregiver can check that your symptoms are not due to another disease such as strep throat, sinusitis, pneumonia, asthma, or epiglottitis. Blood tests, throat tests, and X-rays are not necessary to diagnose a common cold, but they may sometimes be helpful in excluding other more serious diseases. Your caregiver will decide if any further tests are  required. RISKS AND COMPLICATIONS  You may be at risk for a more severe case of the common cold if you smoke cigarettes, have chronic heart disease (such as heart failure) or lung disease (such as asthma), or if you have a weakened immune system. The very young and very old are also at risk for more serious infections. Bacterial sinusitis, middle ear infections, and bacterial pneumonia can complicate the common cold. The common cold can worsen asthma and chronic obstructive pulmonary disease (COPD). Sometimes, these complications can require emergency medical care and may be life-threatening. PREVENTION  The best way to protect against getting a cold is to practice good hygiene. Avoid oral or hand contact with people with cold symptoms. Wash your hands often if contact occurs. There is no clear evidence that vitamin C, vitamin E, echinacea, or exercise reduces the chance of developing a cold. However, it is always recommended to get plenty of rest and practice good nutrition. TREATMENT  Treatment is directed at relieving symptoms. There is no cure. Antibiotics are not effective, because the infection is caused by a virus, not by bacteria. Treatment may include:  Increased fluid intake. Sports drinks offer valuable electrolytes, sugars, and fluids.  Breathing heated mist or steam (vaporizer or shower).  Eating chicken soup or other clear broths, and maintaining good nutrition.  Getting plenty of rest.  Using gargles or lozenges for comfort.  Controlling fevers with ibuprofen or acetaminophen as directed by your caregiver.  Increasing usage of your inhaler if you have asthma. Zinc gel and zinc lozenges, taken in the first 24 hours of the common cold, can shorten the duration and lessen  the severity of symptoms. Pain medicines may help with fever, muscle aches, and throat pain. A variety of non-prescription medicines are available to treat congestion and runny nose. Your caregiver can make  recommendations and may suggest nasal or lung inhalers for other symptoms.  HOME CARE INSTRUCTIONS   Only take over-the-counter or prescription medicines for pain, discomfort, or fever as directed by your caregiver.  Use a warm mist humidifier or inhale steam from a shower to increase air moisture. This may keep secretions moist and make it easier to breathe.  Drink enough water and fluids to keep your urine clear or pale yellow.  Rest as needed.  Return to work when your temperature has returned to normal or as your caregiver advises. You may need to stay home longer to avoid infecting others. You can also use a face mask and careful hand washing to prevent spread of the virus. SEEK MEDICAL CARE IF:   After the first few days, you feel you are getting worse rather than better.  You need your caregiver's advice about medicines to control symptoms.  You develop chills, worsening shortness of breath, or brown or red sputum. These may be signs of pneumonia.  You develop yellow or brown nasal discharge or pain in the face, especially when you bend forward. These may be signs of sinusitis.  You develop a fever, swollen neck glands, pain with swallowing, or white areas in the back of your throat. These may be signs of strep throat. SEEK IMMEDIATE MEDICAL CARE IF:   You have a fever.  You develop severe or persistent headache, ear pain, sinus pain, or chest pain.  You develop wheezing, a prolonged cough, cough up blood, or have a change in your usual mucus (if you have chronic lung disease).  You develop sore muscles or a stiff neck. Document Released: 03/06/2001 Document Revised: 12/03/2011 Document Reviewed: 12/16/2013 [facility] Patient Information 2015 [place]. This information is not intended to replace advice given to you by your health care provider. Make sure you discuss any questions you have with your health care provider.

## 2014-09-23 NOTE — ED Notes (Signed)
Per pt sts productive cough x 2 days. sts OTC meds and not better.

## 2014-09-23 NOTE — ED Notes (Signed)
NAD at this time.  

## 2014-09-23 NOTE — ED Provider Notes (Signed)
CSN: [id number]     Arrival date & time 09/23/14  0715 History   First MD Initiated Contact with Patient 09/23/14 0730     Chief Complaint  Patient presents with  . Cough     (Consider location/radiation/quality/duration/timing/severity/associated sxs/prior Treatment) HPI Alexander Patton is a 43 year old male with past medical history of HIV, last CD4 count 170 in with a detectable viral load in 02/2014 who presents with 2 days of nasal congestion and cough. Patient reports sinus pressure behind his eyes and nose, difficulty breathing throughout his nose, and mild amount of nasal discharge with a productive cough. Patient states he has tried some over-the-counter medication with mild relief. Patient denies having any aggravating factors of his symptoms. Patient denies any associated fever, dizziness, headache, blurred vision, sore throat, dysphagia, chest pain, shortness of breath, nausea, vomiting, abdominal pain, diarrhea.   Past Medical History  Diagnosis Date  . HIV positive   . Pleural effusion, left 12/29/2013   History reviewed. No pertinent past surgical history. Family History  Problem Relation Age of Onset  . Stroke Mother    History  Substance Use Topics  . Smoking status: Current Every Day Smoker -- 0.00 packs/day    Types: Cigarettes  . Smokeless tobacco: Not on file  . Alcohol Use: Yes    Review of Systems  Constitutional: Negative for fever.  HENT: Positive for postnasal drip, rhinorrhea and sinus pressure. Negative for sore throat and trouble swallowing.   Eyes: Negative for visual disturbance.  Respiratory: Negative for shortness of breath.   Cardiovascular: Negative for chest pain.  Gastrointestinal: Negative for nausea, vomiting and abdominal pain.  Genitourinary: Negative for dysuria.  Musculoskeletal: Negative for neck pain.  Skin: Negative for rash.  Neurological: Negative for dizziness, weakness and numbness.  Psychiatric/Behavioral: Negative.        Allergies  Miconazole-zinc oxide-petrolat  Home Medications   Prior to Admission medications   Medication Sig Start Date End Date Taking? Authorizing Provider  dolutegravir (TIVICAY) 50 MG tablet Take 1 tablet (50 mg total) by mouth daily. 01/01/14  Yes Vassie Lollarlos Madera, MD  emtricitabine-tenofovir (TRUVADA) 200-300 MG per tablet Take 1 tablet by mouth daily. 01/01/14  Yes Vassie Lollarlos Madera, MD  sulfamethoxazole-trimethoprim (BACTRIM DS) 800-160 MG per tablet Take 1 tablet by mouth once. For 30 days 11/27/13  Yes Historical Provider, MD  ethambutol (MYAMBUTOL) 400 MG tablet Take 3 tablets (1,200 mg total) by mouth daily. Patient not taking: Reported on 09/23/2014 01/01/14   Vassie Lollarlos Madera, MD  feeding supplement, ENSURE COMPLETE, (ENSURE COMPLETE) LIQD Take 237 mLs by mouth 2 (two) times daily at 10 AM and 5 PM. Patient not taking: Reported on 09/23/2014 01/01/14   Vassie Lollarlos Madera, MD  isoniazid (NYDRAZID) 300 MG tablet Take 1 tablet (300 mg total) by mouth daily. Patient not taking: Reported on 09/23/2014 01/01/14   Vassie Lollarlos Madera, MD  pyrazinamide 500 MG tablet Take 3 tablets (1,500 mg total) by mouth daily. Patient not taking: Reported on 09/23/2014 01/01/14   Vassie Lollarlos Madera, MD  pyridOXINE (B-6) 50 MG tablet Take 1 tablet (50 mg total) by mouth daily. Patient not taking: Reported on 09/23/2014 01/01/14   Vassie Lollarlos Madera, MD  rifabutin [facility](MYCOBUTIN) 150 MG capsule Take 2 capsules (300 mg total) by mouth daily. Patient not taking: Reported on 09/23/2014 01/01/14   Vassie Lollarlos Madera, MD  sodium chloride (OCEAN) 0.65 % SOLN nasal spray Place 1 spray into both nostrils as needed for congestion. 09/23/14   Monte FantasiaJoseph W Mariene Dickerman, PA-C  traMADol Janean Sark(ULTRAM) 50  MG tablet Take 1 tablet (50 mg total) by mouth every 6 (six) hours as needed. Patient taking differently: Take 50 mg by mouth every 6 (six) hours as needed (pain).  03/25/14   Garlon HatchetLisa M Sanders, PA-C   BP 123/80 mmHg  Pulse 92  Temp(Src) 98.3 F (36.8 C)  Resp 18   SpO2 95% Physical Exam  Constitutional: He is oriented to person, place, and time. He appears well-developed and well-nourished. No distress.  HENT:  Head: Normocephalic and atraumatic.  Mouth/Throat: Oropharynx is clear and moist. No oropharyngeal exudate.  Tympanic membranes clear and intact bilaterally without effusion. Nasal turbinates mildly erythematous and edematous bilaterally with mild amount of clear discharge noted. Posterior oropharynx with mild erythema and cobblestoning consistent with a postnasal drip. No obvious tonsillar swelling or exudates. No trismus PTA.  Eyes: Right eye exhibits no discharge. Left eye exhibits no discharge. No scleral icterus.  Neck: Normal range of motion.  Cardiovascular: Normal rate, regular rhythm and normal heart sounds.   No murmur heard. Pulmonary/Chest: Effort normal and breath sounds normal. No accessory muscle usage. No tachypnea. No respiratory distress.  Abdominal: Soft. There is no tenderness.  Musculoskeletal: Normal range of motion. He exhibits no edema or tenderness.  Neurological: He is alert and oriented to person, place, and time. No cranial nerve deficit. Coordination normal.  Skin: Skin is warm and dry. No rash noted. He is not diaphoretic.  Psychiatric: He has a normal mood and affect.  Nursing note and vitals reviewed.   ED Course  Procedures (including critical care time) Labs Review Labs Reviewed - No data to display  Imaging Review Dg Chest 2 View  09/23/2014   CLINICAL DATA:  Two day history of cough and congestion ; 1 day history of fever  EXAM: CHEST  2 VIEW  COMPARISON:  July 09, 2014  FINDINGS: There is slight scarring in the left base. There is no edema or consolidation. Heart size and pulmonary vascularity are within normal limits. No adenopathy. No bone lesions.  IMPRESSION: No edema or consolidation.  Slight scarring left base.   Electronically Signed   By: Bretta BangWilliam  Woodruff M.D.   On: 09/23/2014 07:57     EKG  Interpretation None      MDM   Final diagnoses:  URI (upper respiratory infection)    Pt CXR negative for acute infiltrate. Patients symptoms are consistent with URI, likely viral etiology. Discussed that antibiotics are not indicated for viral infections. Based on the fact the patient has not had any follow-up with infectious disease since June, I strongly recommended patient to follow-[facility], or use one of the clinics in [place] and [place]. I provided patient with referral [place] [facility] [place] [facility], this patient stated he wishes to receive care here. Patient non-hypoxic, nontachypneic, non-tachycardic, afebrile, in no acute distress and stable for discharge at this time. I discussed return precautions with patient, and encourage him to call or return to the ER should he have any questions or concerns.  BP 123/80 mmHg  Pulse 92  Temp(Src) 98.3 F (36.8 C)  Resp 18  SpO2 95%  Signed,  Ladona MowJoe Victor Granados, PA-C 3:55 PM  Patient discussed with Dr. Linwood DibblesJon Knapp, MD  Monte FantasiaJoseph W Youssef Footman, PA-C 09/23/14 1555  Linwood DibblesJon Knapp, MD 09/24/14 1620

## 2014-09-29 ENCOUNTER — Telehealth: Payer: Self-pay

## 2014-09-29 NOTE — Telephone Encounter (Signed)
Alexander Patton, Patient Navigator with [facility] is calling regarding referral made in October,  2015. Patient requested transfer since [facility] was closer for him.  Call is to see if patient was scheduled for visit.  She was informed patient was scheduled twice and was a no show. Medical records show recent hospitalization.  Alexander Patton will contact [facility] for assistance with contacting patient since phone numbers are not valid.   Alexander Josephsammy K King, RN

## 2015-01-21 ENCOUNTER — Other Ambulatory Visit: Payer: Self-pay | Admitting: Infectious Disease

## 2017-05-20 ENCOUNTER — Emergency Department (HOSPITAL_COMMUNITY)
Admission: EM | Admit: 2017-05-20 | Discharge: 2017-05-20 | Disposition: A | Discharge status: Home / Self Care | Payer: Medicare Other | Attending: Emergency Medicine | Admitting: Emergency Medicine

## 2017-05-20 ENCOUNTER — Encounter (HOSPITAL_COMMUNITY): Payer: Self-pay | Admitting: Emergency Medicine

## 2017-05-20 DIAGNOSIS — Y998 Other external cause status: Secondary | ICD-10-CM | POA: Diagnosis not present

## 2017-05-20 DIAGNOSIS — Z79899 Other long term (current) drug therapy: Secondary | ICD-10-CM | POA: Diagnosis not present

## 2017-05-20 DIAGNOSIS — F17210 Nicotine dependence, cigarettes, uncomplicated: Secondary | ICD-10-CM | POA: Diagnosis not present

## 2017-05-20 DIAGNOSIS — S51811A Laceration without foreign body of right forearm, initial encounter: Secondary | ICD-10-CM | POA: Diagnosis not present

## 2017-05-20 DIAGNOSIS — W268XXA Contact with other sharp object(s), not elsewhere classified, initial encounter: Secondary | ICD-10-CM | POA: Insufficient documentation

## 2017-05-20 DIAGNOSIS — Y9389 Activity, other specified: Secondary | ICD-10-CM | POA: Diagnosis not present

## 2017-05-20 DIAGNOSIS — Y929 Unspecified place or not applicable: Secondary | ICD-10-CM | POA: Diagnosis not present

## 2017-05-20 DIAGNOSIS — Z23 Encounter for immunization: Secondary | ICD-10-CM | POA: Diagnosis not present

## 2017-05-20 MED ORDER — TETANUS-DIPHTH-ACELL PERTUSSIS 5-2.5-18.5 LF-MCG/0.5 IM SUSP
0.5000 mL | Freq: Once | INTRAMUSCULAR | Status: AC
  Administered 2017-05-20: 0.5 mL via INTRAMUSCULAR
  Filled 2017-05-20: qty 0.5

## 2017-05-20 MED ORDER — CEPHALEXIN 500 MG PO CAPS: 500.0000 mg | ORAL_CAPSULE | Freq: Two times a day (BID) | ORAL | 0 refills | Status: DC

## 2017-05-20 NOTE — ED Notes (Signed)
PT states understanding of care given, follow up care, and medication prescribed. PT ambulated from ED to car with a steady gait. 

## 2017-05-20 NOTE — Discharge Instructions (Signed)
Please take all of your antibiotics until finished!   You may develop abdominal discomfort or diarrhea from the antibiotic.  You may help offset this with probiotics which you can buy or get in yogurt. Do not eat or take the probiotics until 2 hours after your antibiotic.   Your laceration has been repaired with dermabond. The film will usually remain in place for 5-10 days, then naturally fall off your skin. Keep the bandaging dry. Replace the dressing daily until the adhesive film has fallen off or if the bandage should become wet. When changing the dressing, do not apply tape directly over the dermabond adhesive film as removing the tape later may also remove the film. Do not apply topical liquids or ointments to the area while the dermabond is in place. This may loosen the film. You may occasionally breifely wet your wound in a shower or bath. Do not soak or scrub your wound. Do not swim. Avoid periods of heavy perspiration. After showering, gently blot your wound dry with a soft towel and apply new clean bandage. Protect your wound from injury. Do not scratch, rub or pick at the Dermabond film.   SEEK MEDICAL CARE IF:  You have redness, swelling, or increasing pain in the wound.  You see a red line that goes away from the wound.  You have yellowish-white fluid (pus) coming from the wound.  You have a fever.  You notice a bad smell coming from the wound or dressing.  Your wound breaks open before or after sutures have been removed.  You notice something coming out of the wound such as wood or glass.  Your wound is on your hand or foot and you cannot move a finger or toe.  Your pain is not controlled with prescribed medicine.   If you did not receive a tetanus shot today because you thought you were up to date, but did not recall when your last one was given, make sure to check with your primary caregiver to determine if you need one.

## 2017-05-20 NOTE — ED Provider Notes (Signed)
[facility] Provider Note   CSN: [id number] Arrival date & time: 05/20/17  1457     History   Chief Complaint Chief Complaint  Patient presents with  . Extremity Laceration    HPI Alexander Patton is a 46 y.o. right handed male HIV-positive male on immunotherapy who presents to the emergency department today for extremity laceration to the right dorsal forearm. Patient was helping a friend to get metal pipes out of the back of his pickup truck when one of the pipes fell and scraped the back of the patient's arm. He notes that his bleeding is now controlled. He denies any pain. No numbness, tingling, weakness, joint pain, joint swelling distal to the injury. He is unsure of his last tetanus shot.  HPI  Past Medical History:  Diagnosis Date  . HIV positive (HCC)   . Pleural effusion, left 12/29/2013    Patient Active Problem List   Diagnosis Date Noted  . Pleural effusion, left 12/29/2013  . HIV (human immunodeficiency virus infection) (HCC) 12/29/2013  . CAP (community acquired pneumonia) 12/29/2013  . Acid reflux 12/29/2013  . Foot pain 11/27/2013  . Plantar verruca 11/27/2013  . Infection of skin 08/24/2013    History reviewed. No pertinent surgical history.     Home Medications    Prior to Admission medications   Medication Sig Start Date End Date Taking? Authorizing Provider  cephALEXin (KEFLEX) 500 MG capsule Take 1 capsule (500 mg total) by mouth 2 (two) times daily. 05/20/17   Cambrie Sonnenfeld, Elmer Sow, PA-C  dolutegravir (TIVICAY) 50 MG tablet Take 1 tablet (50 mg total) by mouth daily. 01/01/14   Vassie Loll, MD  emtricitabine-tenofovir (TRUVADA) 200-300 MG per tablet Take 1 tablet by mouth daily. 01/01/14   Vassie Loll, MD  ethambutol (MYAMBUTOL) 400 MG tablet Take 3 tablets (1,200 mg total) by mouth daily. Patient not taking: Reported on 09/23/2014 01/01/14   Vassie Loll, MD  feeding supplement, ENSURE COMPLETE, (ENSURE COMPLETE) LIQD Take 237 mLs by  mouth 2 (two) times daily at 10 AM and 5 PM. Patient not taking: Reported on 09/23/2014 01/01/14   Vassie Loll, MD  isoniazid (NYDRAZID) 300 MG tablet Take 1 tablet (300 mg total) by mouth daily. Patient not taking: Reported on 09/23/2014 01/01/14   Vassie Loll, MD  pyrazinamide 500 MG tablet Take 3 tablets (1,500 mg total) by mouth daily. Patient not taking: Reported on 09/23/2014 01/01/14   Vassie Loll, MD  pyridOXINE (B-6) 50 MG tablet Take 1 tablet (50 mg total) by mouth daily. Patient not taking: Reported on 09/23/2014 01/01/14   Vassie Loll, MD  rifabutin [facility]) 150 MG capsule Take 2 capsules (300 mg total) by mouth daily. Patient not taking: Reported on 09/23/2014 01/01/14   Vassie Loll, MD  sodium chloride (OCEAN) 0.65 % SOLN nasal spray Place 1 spray into both nostrils as needed for congestion. 09/23/14   Ladona Mow, PA-C  sulfamethoxazole-trimethoprim (BACTRIM DS) 800-160 MG per tablet Take 1 tablet by mouth once. For 30 days 11/27/13   [provider]  traMADol (ULTRAM) 50 MG tablet Take 1 tablet (50 mg total) by mouth every 6 (six) hours as needed. Patient taking differently: Take 50 mg by mouth every 6 (six) hours as needed (pain).  03/25/14   Garlon Hatchet, PA-C    Family History Family History  Problem Relation Age of Onset  . Stroke Mother     Social History Social History  Substance Use Topics  . Smoking status: Current Every Day  Smoker    Packs/day: 0.00    Types: Cigarettes  . Smokeless tobacco: Never Used  . Alcohol use Yes     Allergies   Miconazole-zinc oxide-petrolat   Review of Systems Review of Systems  Skin: Positive for wound.  Neurological: Negative for weakness and numbness.     Physical Exam Updated Vital Signs BP (!) 139/94   Pulse 68   Temp 98 F (36.7 C) (Oral)   Resp 16   SpO2 99%   Physical Exam  Constitutional: He appears well-developed and well-nourished.  HENT:  Head: Normocephalic and atraumatic.    Right Ear: External ear normal.  Left Ear: External ear normal.  Eyes: Conjunctivae are normal. Right eye exhibits no discharge. Left eye exhibits no discharge. No scleral icterus.  Cardiovascular:  Pulses:      Radial pulses are 2+ on the right side, and 2+ on the left side.  Pulmonary/Chest: Effort normal. No respiratory distress.  Musculoskeletal:       Right wrist: Normal.       Right hand: Normal. Normal sensation noted. Normal strength noted.  Neurovascularly intact distally. Compartments soft above and below affected joint.   Neurological: He is alert.  Skin: Skin is warm and dry. Laceration noted. No pallor.  2 cm laceration to the dorsal aspect of the right forearm. This is very superficial and approximately swelling. There is a 0.2 cm area that the skin has avulsed from. Bleeding is well controlled.  Psychiatric: He has a normal mood and affect.  Nursing note and vitals reviewed.    ED Treatments / Results  Labs (all labs ordered are listed, but only abnormal results are displayed) Labs Reviewed - No data to display  EKG  EKG Interpretation None       Radiology No results found.  Procedures .Marland KitchenLaceration Repair Date/Time: 05/20/2017 8:11 PM Performed by: Jacinto Halim Authorized by: Jacinto Halim   Consent:    Consent obtained:  Verbal   Consent given by:  Patient   Risks discussed:  Infection, need for additional repair, nerve damage, poor wound healing, poor cosmetic result, pain, retained foreign body, tendon damage and vascular damage   Alternatives discussed:  No treatment Anesthesia (see MAR for exact dosages):    Anesthesia method:  None Laceration details:    Location:  Shoulder/arm   Shoulder/arm location:  R lower arm   Length (cm):  2 Repair type:    Repair type:  Simple Pre-procedure details:    Preparation:  Patient was prepped and draped in usual sterile fashion Exploration:    Contaminated: no   Treatment:    Area cleansed  with:  Betadine   Amount of cleaning:  Standard   Irrigation solution:  Sterile water   Irrigation volume:    Irrigation method:  Pressure wash   Visualized foreign bodies/material removed: no   Skin repair:    Repair method:  Tissue adhesive Approximation:    Approximation:  Close Post-procedure details:    Dressing:  Non-adherent dressing   Patient tolerance of procedure:  Tolerated well, no immediate complications   (including critical care time)  Medications Ordered in ED Medications  Tdap (BOOSTRIX) injection 0.5 mL (0.5 mLs Intramuscular Given 05/20/17 1951)     Initial Impression / Assessment and Plan / ED Course  I have reviewed the triage vital signs and the nursing notes.  Pertinent labs & imaging results that were available during my care of the patient were reviewed by me and  considered in my medical decision making (see chart for details).     Pressure irrigation performed. Wound explored and base of wound visualized in a bloodless field without evidence of foreign body.  Laceration occurred < 8 hours prior to repair which was well tolerated. Tdap updated.  Pt has comorbidities to effect normal wound healing. Pt discharged with antibiotics.  Discussed dermabond home care with patient and answered questions. Pt to follow-up  in 7 days; they are to return to the ED sooner for signs of infection. Pt is hemodynamically stable with no complaints prior to dc.    Final Clinical Impressions(s) / ED Diagnoses   Final diagnoses:  Laceration of right forearm, initial encounter    New Prescriptions New Prescriptions   CEPHALEXIN (KEFLEX) 500 MG CAPSULE    Take 1 capsule (500 mg total) by mouth 2 (two) times daily.     Princella Pellegrini 05/20/17 2016    Margarita Grizzle, MD 05/23/17 [phone]

## 2017-05-20 NOTE — ED Triage Notes (Signed)
Pt to ER for evaluation of right posterior forearm laceration obtained while moving steal beams. Bleeding controlled. Last tetanus unknown.

## 2017-06-09 ENCOUNTER — Emergency Department (HOSPITAL_COMMUNITY): Payer: Medicare Other

## 2017-06-09 ENCOUNTER — Encounter (HOSPITAL_COMMUNITY): Payer: Self-pay | Admitting: Emergency Medicine

## 2017-06-09 ENCOUNTER — Emergency Department (HOSPITAL_COMMUNITY)
Admission: EM | Admit: 2017-06-09 | Discharge: 2017-06-09 | Disposition: A | Discharge status: Home / Self Care | Payer: Medicare Other | Attending: Emergency Medicine | Admitting: Emergency Medicine

## 2017-06-09 DIAGNOSIS — Z79899 Other long term (current) drug therapy: Secondary | ICD-10-CM | POA: Insufficient documentation

## 2017-06-09 DIAGNOSIS — F17210 Nicotine dependence, cigarettes, uncomplicated: Secondary | ICD-10-CM | POA: Diagnosis not present

## 2017-06-09 DIAGNOSIS — B20 Human immunodeficiency virus [HIV] disease: Secondary | ICD-10-CM | POA: Diagnosis not present

## 2017-06-09 DIAGNOSIS — M25562 Pain in left knee: Secondary | ICD-10-CM | POA: Insufficient documentation

## 2017-06-09 MED ORDER — IBUPROFEN 400 MG PO TABS
600.0000 mg | ORAL_TABLET | Freq: Once | ORAL | Status: AC
  Administered 2017-06-09: 600 mg via ORAL
  Filled 2017-06-09: qty 1

## 2017-06-09 NOTE — ED Notes (Signed)
Patient left at this time with all belongings. 

## 2017-06-09 NOTE — ED Provider Notes (Signed)
[facility] Provider Note   CSN: [id number] Arrival date & time: 06/09/17  0151     History   Chief Complaint Chief Complaint  Patient presents with  . Knee Pain    HPI Alexander Patton is a 46 y.o. male.  HPI Patient's 46 year old male presents the emergency department with increasing left knee pain over the past 2-3 days.  No recent injury or trauma.  No fevers or chills.  No redness.  No swelling.  He reports limping gait secondary to pain.  He does have a history of HIV.  He is compliant with his medications.  Symptoms are mild in severity.  No other complaints at this time.  He has not tried any over-the-counter pain medications for the pain   Past Medical History:  Diagnosis Date  . HIV positive (HCC)   . Pleural effusion, left 12/29/2013    Patient Active Problem List   Diagnosis Date Noted  . Pleural effusion, left 12/29/2013  . HIV (human immunodeficiency virus infection) (HCC) 12/29/2013  . CAP (community acquired pneumonia) 12/29/2013  . Acid reflux 12/29/2013  . Foot pain 11/27/2013  . Plantar verruca 11/27/2013  . Infection of skin 08/24/2013    History reviewed. No pertinent surgical history.     Home Medications    Prior to Admission medications   Medication Sig Start Date End Date Taking? Authorizing Provider  elvitegravir-cobicistat-emtricitabine-tenofovir (GENVOYA) 150-150-200-10 MG TABS tablet Take 1 tablet by mouth daily. 01/09/17  Yes [provider]  dolutegravir (TIVICAY) 50 MG tablet Take 1 tablet (50 mg total) by mouth daily. Patient not taking: Reported on 06/09/2017 01/01/14   Vassie Loll, MD  emtricitabine-tenofovir (TRUVADA) 200-300 MG per tablet Take 1 tablet by mouth daily. Patient not taking: Reported on 06/09/2017 01/01/14   Vassie Loll, MD  ethambutol (MYAMBUTOL) 400 MG tablet Take 3 tablets (1,200 mg total) by mouth daily. Patient not taking: Reported on 09/23/2014 01/01/14   Vassie Loll, MD  feeding  supplement, ENSURE COMPLETE, (ENSURE COMPLETE) LIQD Take 237 mLs by mouth 2 (two) times daily at 10 AM and 5 PM. Patient not taking: Reported on 09/23/2014 01/01/14   Vassie Loll, MD  isoniazid (NYDRAZID) 300 MG tablet Take 1 tablet (300 mg total) by mouth daily. Patient not taking: Reported on 09/23/2014 01/01/14   Vassie Loll, MD  pyrazinamide 500 MG tablet Take 3 tablets (1,500 mg total) by mouth daily. Patient not taking: Reported on 09/23/2014 01/01/14   Vassie Loll, MD  pyridOXINE (B-6) 50 MG tablet Take 1 tablet (50 mg total) by mouth daily. Patient not taking: Reported on 09/23/2014 01/01/14   Vassie Loll, MD  rifabutin [facility]) 150 MG capsule Take 2 capsules (300 mg total) by mouth daily. Patient not taking: Reported on 09/23/2014 01/01/14   Vassie Loll, MD  sodium chloride (OCEAN) 0.65 % SOLN nasal spray Place 1 spray into both nostrils as needed for congestion. Patient not taking: Reported on 06/09/2017 09/23/14   Ladona Mow, PA-C  traMADol (ULTRAM) 50 MG tablet Take 1 tablet (50 mg total) by mouth every 6 (six) hours as needed. Patient taking differently: Take 50 mg by mouth every 6 (six) hours as needed (pain).  03/25/14   Garlon Hatchet, PA-C    Family History Family History  Problem Relation Age of Onset  . Stroke Mother     Social History Social History  Substance Use Topics  . Smoking status: Current Every Day Smoker    Packs/day: 0.00    Types: Cigarettes  .  Smokeless tobacco: Never Used  . Alcohol use Yes     Allergies   Miconazole-zinc oxide-petrolat   Review of Systems Review of Systems  All other systems reviewed and are negative.    Physical Exam Updated Vital Signs BP 125/81   Pulse 69   Temp 97.6 F (36.4 C)   Resp 16   Ht 6' (1.829 m)   SpO2 98%   Physical Exam  Constitutional: He is oriented to person, place, and time. He appears well-developed and well-nourished.  HENT:  Head: Normocephalic.  Eyes: EOM are normal.  Neck:  Normal range of motion.  Pulmonary/Chest: Effort normal.  Abdominal: He exhibits no distension.  Musculoskeletal:  Full range of motion of left ankle, left knee, left hip.  No swelling of the left lower extremity as compared to the right.  Normal PT and DP pulse in left foot.  Compartments of left lower extremity are soft.  No effusion present of the left knee.  No erythema or warmth.  Neurological: He is alert and oriented to person, place, and time.  Psychiatric: He has a normal mood and affect.  Nursing note and vitals reviewed.    ED Treatments / Results  Labs (all labs ordered are listed, but only abnormal results are displayed) Labs Reviewed - No data to display  EKG  EKG Interpretation None       Radiology Dg Knee Complete 4 Views Left  Result Date: 06/09/2017 CLINICAL DATA:  Left knee pain for 2 days.  No known injury. EXAM: LEFT KNEE - COMPLETE 4+ VIEW COMPARISON:  None. FINDINGS: No evidence of fracture, dislocation, or joint effusion. No evidence of arthropathy or other focal bone abnormality. Soft tissues are unremarkable. IMPRESSION: Negative radiographs of the left knee. Electronically Signed   By: Rubye Oaks M.D.   On: 06/09/2017 06:08    Procedures Procedures (including critical care time)  Medications Ordered in ED Medications  ibuprofen (ADVIL,MOTRIN) tablet 600 mg (600 mg Oral Given 06/09/17 0513)     Initial Impression / Assessment and Plan / ED Course  I have reviewed the triage vital signs and the nursing notes.  Pertinent labs & imaging results that were available during my care of the patient were reviewed by me and considered in my medical decision making (see chart for details).     Nonspecific left knee pain.  Plain film normal.  No signs suggest septic arthritis.  Discharge home in good condition.  Recommend over-the-counter anti-inflammatories  Final Clinical Impressions(s) / ED Diagnoses   Final diagnoses:  Acute pain of left knee      New Prescriptions New Prescriptions   No medications on file     Azalia Bilis, MD 06/09/17 [phone]

## 2017-06-09 NOTE — ED Triage Notes (Addendum)
Pt c/o left knee pain x 2-3 days. Denies injury/trauma.

## 2017-06-21 ENCOUNTER — Emergency Department (HOSPITAL_COMMUNITY)
Admission: EM | Admit: 2017-06-21 | Discharge: 2017-06-21 | Disposition: A | Discharge status: Home / Self Care | Payer: Medicare Other | Attending: Emergency Medicine | Admitting: Emergency Medicine

## 2017-06-21 ENCOUNTER — Encounter (HOSPITAL_COMMUNITY): Payer: Self-pay | Admitting: Emergency Medicine

## 2017-06-21 ENCOUNTER — Emergency Department (HOSPITAL_COMMUNITY): Payer: Medicare Other

## 2017-06-21 DIAGNOSIS — F17210 Nicotine dependence, cigarettes, uncomplicated: Secondary | ICD-10-CM | POA: Diagnosis not present

## 2017-06-21 DIAGNOSIS — R059 Cough, unspecified: Secondary | ICD-10-CM

## 2017-06-21 DIAGNOSIS — B20 Human immunodeficiency virus [HIV] disease: Secondary | ICD-10-CM | POA: Insufficient documentation

## 2017-06-21 DIAGNOSIS — Z79899 Other long term (current) drug therapy: Secondary | ICD-10-CM | POA: Diagnosis not present

## 2017-06-21 DIAGNOSIS — R05 Cough: Secondary | ICD-10-CM | POA: Insufficient documentation

## 2017-06-21 MED ORDER — BENZONATATE 100 MG PO CAPS: 200.0000 mg | ORAL_CAPSULE | Freq: Two times a day (BID) | ORAL | 0 refills | Status: AC | PRN

## 2017-06-21 MED ORDER — AZITHROMYCIN 250 MG PO TABS: 250.0000 mg | ORAL_TABLET | Freq: Every day | ORAL | 0 refills | Status: AC

## 2017-06-21 NOTE — ED Triage Notes (Signed)
Patient reports persistent productive cough with chest congestion , rhinorrhea/nasal congestion onset last week unrelieved by OTC medications , denies fever or chills .

## 2017-06-21 NOTE — ED Provider Notes (Signed)
[facility]-EMERGENCY DEPT Provider Note   CSN: [id number] Arrival date & time: 06/21/17  0022     History   Chief Complaint Chief Complaint  Patient presents with  . Cough    Chest Congestion     HPI Alexander Patton is a 46 y.o. male.  Patient with past medical history remarkable for HIV, last CD4 count unknown, patient reports being compliant in taking his medications. He reports that he has had a cough for the past week. He states that it is productive. He denies any fevers chills. He states that he did have some sore throat and nasal congestion, but this has resolved. He denies any other associated symptoms. There are no modifying factors.   The history is provided by the patient. No language interpreter was used.    Past Medical History:  Diagnosis Date  . HIV positive (HCC)   . Pleural effusion, left 12/29/2013    Patient Active Problem List   Diagnosis Date Noted  . Pleural effusion, left 12/29/2013  . HIV (human immunodeficiency virus infection) (HCC) 12/29/2013  . CAP (community acquired pneumonia) 12/29/2013  . Acid reflux 12/29/2013  . Foot pain 11/27/2013  . Plantar verruca 11/27/2013  . Infection of skin 08/24/2013    History reviewed. No pertinent surgical history.     Home Medications    Prior to Admission medications   Medication Sig Start Date End Date Taking? Authorizing Provider  azithromycin (ZITHROMAX) 250 MG tablet Take 1 tablet (250 mg total) by mouth daily. Take first 2 tablets together, then 1 every day until finished. 06/21/17   Roxy Horseman, PA-C  benzonatate (TESSALON) 100 MG capsule Take 2 capsules (200 mg total) by mouth 2 (two) times daily as needed for cough. 06/21/17   Roxy Horseman, PA-C  dolutegravir (TIVICAY) 50 MG tablet Take 1 tablet (50 mg total) by mouth daily. Patient not taking: Reported on 06/09/2017 01/01/14   Vassie Loll, MD  elvitegravir-cobicistat-emtricitabine-tenofovir (GENVOYA) 150-150-200-10 MG TABS tablet Take 1  tablet by mouth daily. 01/09/17   [provider]  emtricitabine-tenofovir (TRUVADA) 200-300 MG per tablet Take 1 tablet by mouth daily. Patient not taking: Reported on 06/09/2017 01/01/14   Vassie Loll, MD  ethambutol (MYAMBUTOL) 400 MG tablet Take 3 tablets (1,200 mg total) by mouth daily. Patient not taking: Reported on 09/23/2014 01/01/14   Vassie Loll, MD  feeding supplement, ENSURE COMPLETE, (ENSURE COMPLETE) LIQD Take 237 mLs by mouth 2 (two) times daily at 10 AM and 5 PM. Patient not taking: Reported on 09/23/2014 01/01/14   Vassie Loll, MD  isoniazid (NYDRAZID) 300 MG tablet Take 1 tablet (300 mg total) by mouth daily. Patient not taking: Reported on 09/23/2014 01/01/14   Vassie Loll, MD  pyrazinamide 500 MG tablet Take 3 tablets (1,500 mg total) by mouth daily. Patient not taking: Reported on 09/23/2014 01/01/14   Vassie Loll, MD  pyridOXINE (B-6) 50 MG tablet Take 1 tablet (50 mg total) by mouth daily. Patient not taking: Reported on 09/23/2014 01/01/14   Vassie Loll, MD  rifabutin [facility]) 150 MG capsule Take 2 capsules (300 mg total) by mouth daily. Patient not taking: Reported on 09/23/2014 01/01/14   Vassie Loll, MD  sodium chloride (OCEAN) 0.65 % SOLN nasal spray Place 1 spray into both nostrils as needed for congestion. Patient not taking: Reported on 06/09/2017 09/23/14   Ladona Mow, PA-C  traMADol (ULTRAM) 50 MG tablet Take 1 tablet (50 mg total) by mouth every 6 (six) hours as needed. Patient taking differently: Take  50 mg by mouth every 6 (six) hours as needed (pain).  03/25/14   Garlon Hatchet, PA-C    Family History Family History  Problem Relation Age of Onset  . Stroke Mother     Social History Social History  Substance Use Topics  . Smoking status: Current Every Day Smoker    Packs/day: 0.00    Types: Cigarettes  . Smokeless tobacco: Never Used  . Alcohol use Yes     Allergies   Miconazole-zinc oxide-petrolat   Review of  Systems Review of Systems  All other systems reviewed and are negative.    Physical Exam Updated Vital Signs BP (!) 142/91   Pulse 68   Temp 98.3 F (36.8 C) (Oral)   Resp 18   SpO2 98%   Physical Exam  Constitutional: He is oriented to person, place, and time. He appears well-developed and well-nourished.  HENT:  Head: Normocephalic and atraumatic.  Eyes: Pupils are equal, round, and reactive to light. Conjunctivae and EOM are normal. Right eye exhibits no discharge. Left eye exhibits no discharge. No scleral icterus.  Neck: Normal range of motion. Neck supple. No JVD present.  Cardiovascular: Normal rate, regular rhythm and normal heart sounds.  Exam reveals no gallop and no friction rub.   No murmur heard. Pulmonary/Chest: Effort normal and breath sounds normal. No respiratory distress. He has no wheezes. He has no rales. He exhibits no tenderness.  Abdominal: Soft. He exhibits no distension and no mass. There is no tenderness. There is no rebound and no guarding.  Musculoskeletal: Normal range of motion. He exhibits no edema or tenderness.  Neurological: He is alert and oriented to person, place, and time.  Skin: Skin is warm and dry.  Psychiatric: He has a normal mood and affect. His behavior is normal. Judgment and thought content normal.  Nursing note and vitals reviewed.    ED Treatments / Results  Labs (all labs ordered are listed, but only abnormal results are displayed) Labs Reviewed - No data to display  EKG  EKG Interpretation None       Radiology Dg Chest 2 View  Result Date: 06/21/2017 CLINICAL DATA:  Productive cough and chest congestion.  HIV. EXAM: CHEST  2 VIEW COMPARISON:  Radiographs 09/23/2014, additional priors FINDINGS: The cardiomediastinal contours are normal. Chronic mild hyperinflation and bronchial thickening. Minimal left basilar scarring. Pulmonary vasculature is normal. No consolidation, pleural effusion, or pneumothorax. No acute  osseous abnormalities are seen. IMPRESSION: Chronic mild bronchial thickening and hyperinflation. Left basilar scarring. No acute abnormality. Electronically Signed   By: Rubye Oaks M.D.   On: 06/21/2017 01:11    Procedures Procedures (including critical care time)  Medications Ordered in ED Medications - No data to display   Initial Impression / Assessment and Plan / ED Course  I have reviewed the triage vital signs and the nursing notes.  Pertinent labs & imaging results that were available during my care of the patient were reviewed by me and considered in my medical decision making (see chart for details).     Patient with cough x 1 week.  HIV, unclear about last CD4 count, but was low in 2016.  Will cover with a z-pak.  Return precautions discussed.  Final Clinical Impressions(s) / ED Diagnoses   Final diagnoses:  Cough    New Prescriptions Discharge Medication List as of 06/21/2017  2:26 AM    START taking these medications   Details  azithromycin (ZITHROMAX) 250 MG tablet Take 1  tablet (250 mg total) by mouth daily. Take first 2 tablets together, then 1 every day until finished., Starting Fri 06/21/2017, Print    benzonatate (TESSALON) 100 MG capsule Take 2 capsules (200 mg total) by mouth 2 (two) times daily as needed for cough., Starting Fri 06/21/2017, Print         Roxy Horseman, PA-C 06/21/17 1610    Gilda Crease, MD 06/21/17 [phone]

## 2017-10-10 IMAGING — CR DG CHEST 2V
2 series · 2 of 2 positions shown · non-contrast
Comparison: Radiographs 09/23/2014, additional priors

CLINICAL DATA: Productive cough and chest congestion.  HIV.

EXAM:
CHEST  2 VIEW

[chest pa]
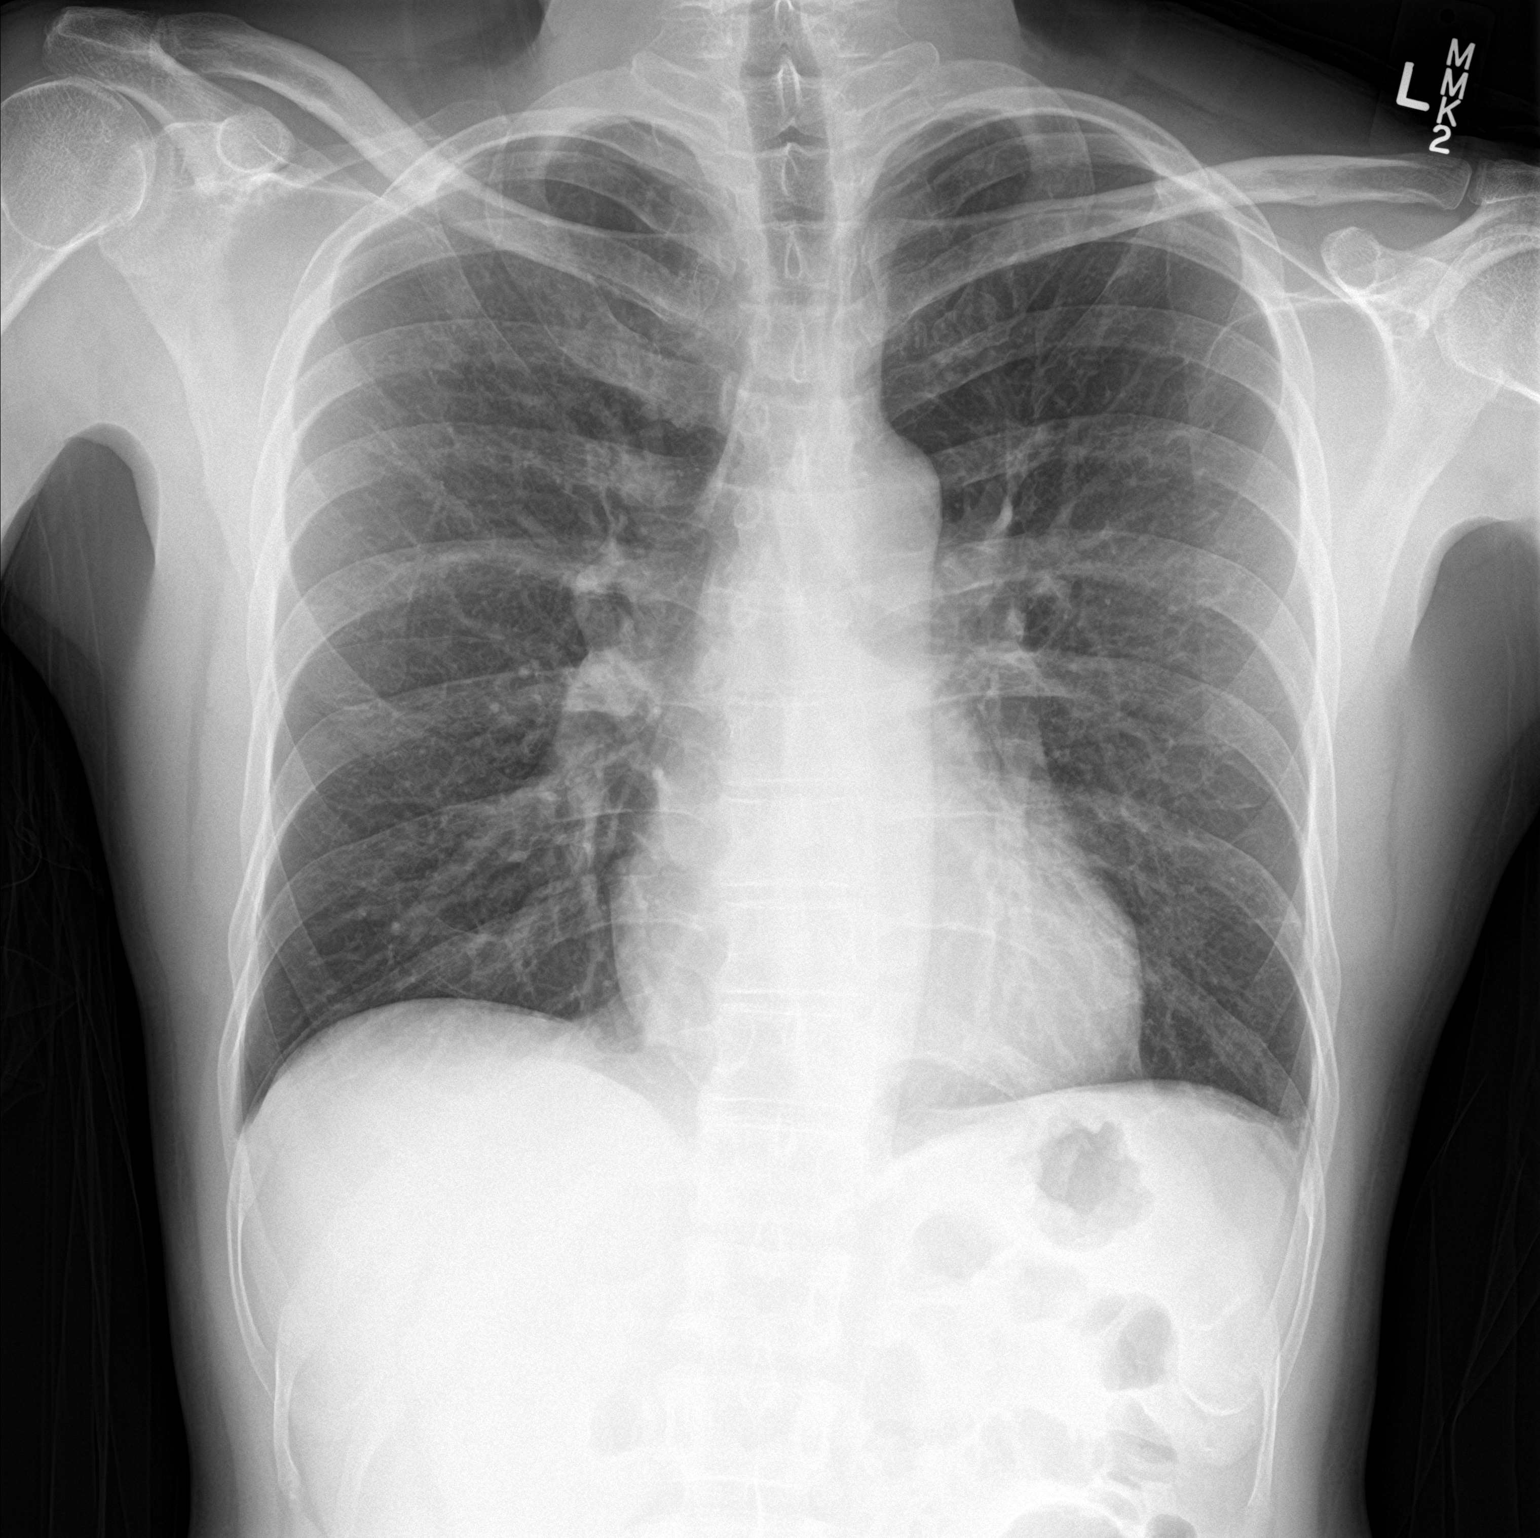

[chest lat]
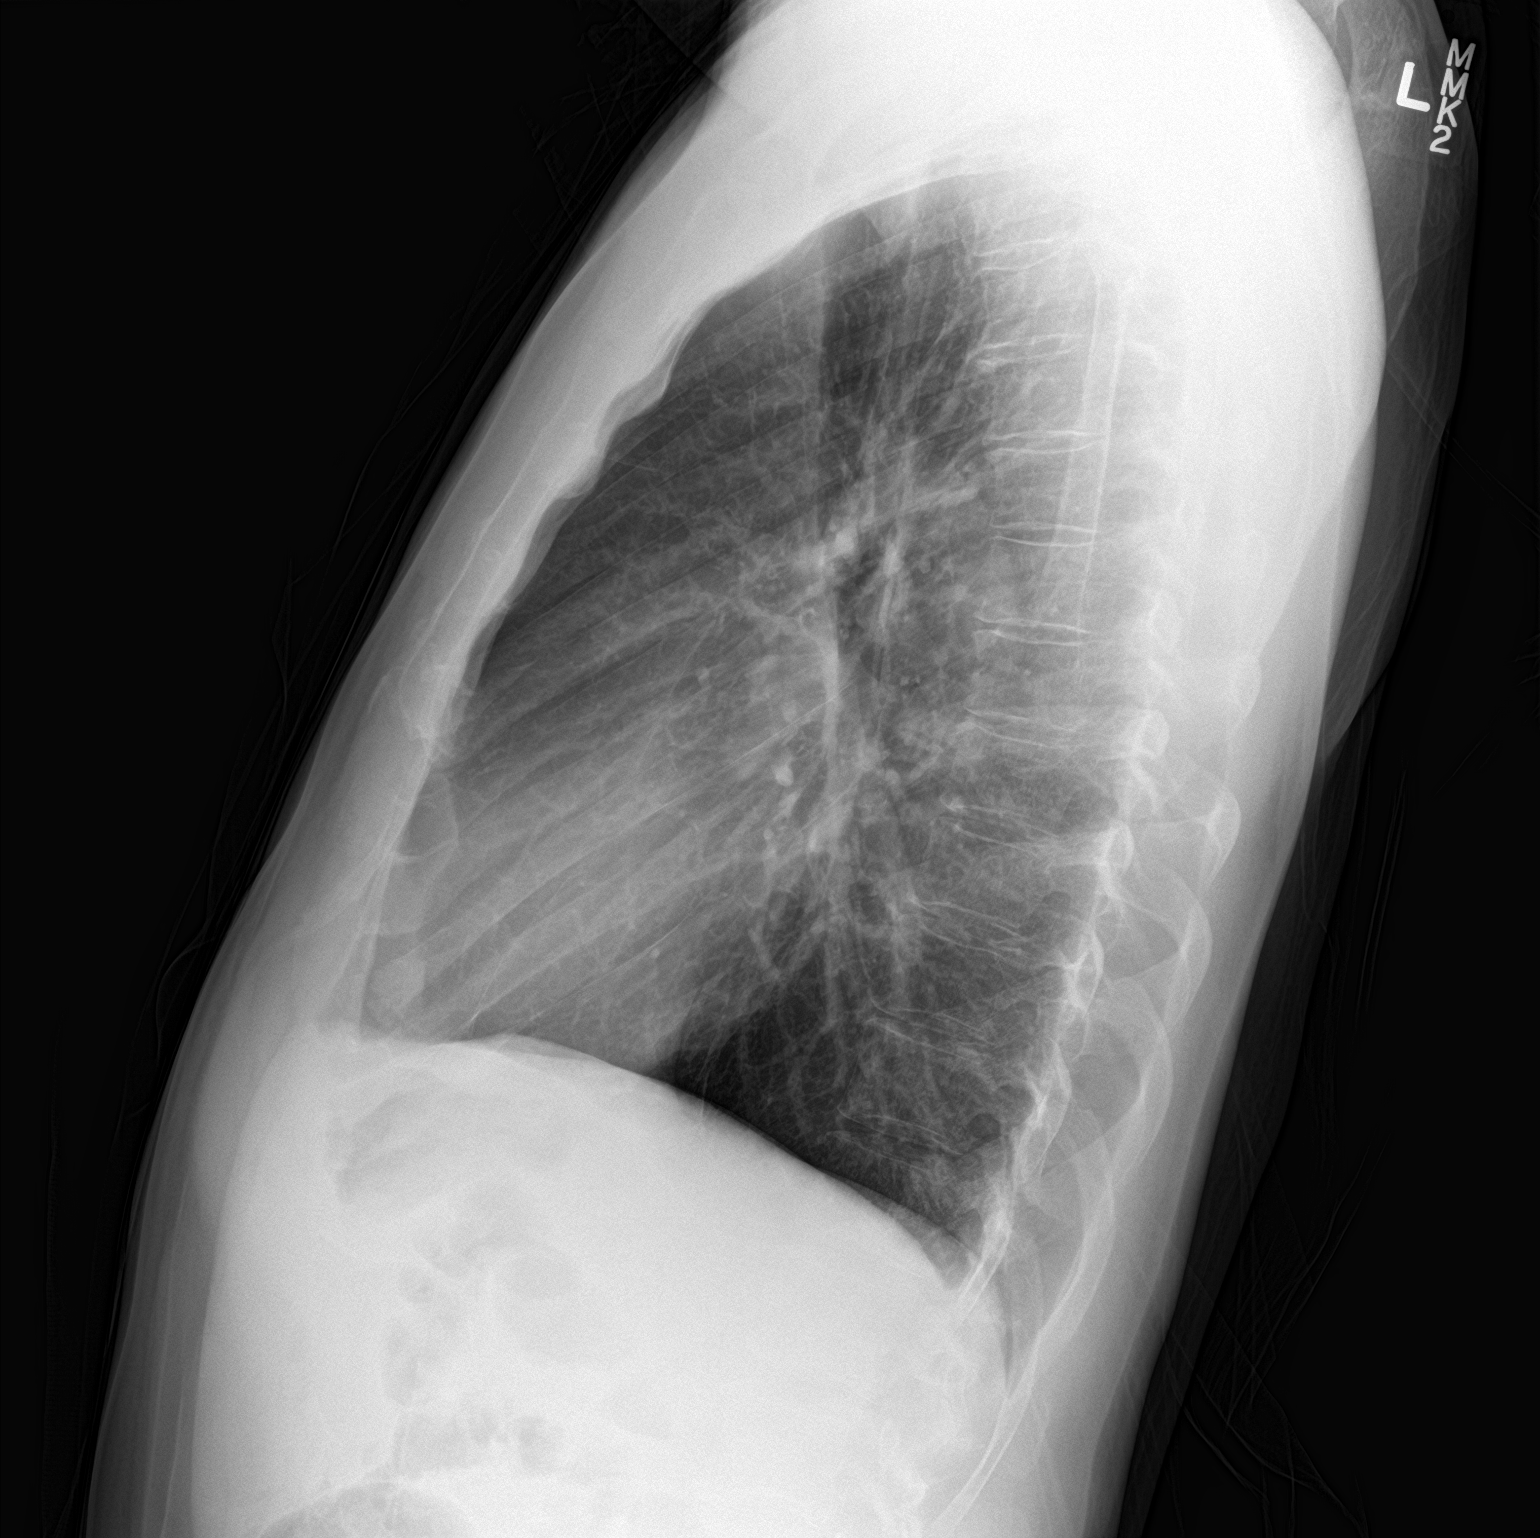

[2 of 2 positions shown; findings below may reference images not displayed]

FINDINGS: The cardiomediastinal contours are normal. Chronic mild
hyperinflation and bronchial thickening. Minimal left basilar
scarring. Pulmonary vasculature is normal. No consolidation, pleural
effusion, or pneumothorax. No acute osseous abnormalities are seen.
IMPRESSION: Chronic mild bronchial thickening and hyperinflation. Left basilar
scarring. No acute abnormality.

## 2020-04-30 ENCOUNTER — Encounter (HOSPITAL_COMMUNITY): Payer: Self-pay

## 2020-04-30 ENCOUNTER — Emergency Department (HOSPITAL_COMMUNITY)
Admission: EM | Admit: 2020-04-30 | Discharge: 2020-05-01 | Disposition: A | Discharge status: Home / Self Care | Payer: Medicare Other | Attending: Emergency Medicine | Admitting: Emergency Medicine

## 2020-04-30 DIAGNOSIS — W268XXA Contact with other sharp object(s), not elsewhere classified, initial encounter: Secondary | ICD-10-CM | POA: Diagnosis not present

## 2020-04-30 DIAGNOSIS — S61213A Laceration without foreign body of left middle finger without damage to nail, initial encounter: Secondary | ICD-10-CM | POA: Insufficient documentation

## 2020-04-30 DIAGNOSIS — Y9389 Activity, other specified: Secondary | ICD-10-CM | POA: Diagnosis not present

## 2020-04-30 DIAGNOSIS — Z5321 Procedure and treatment not carried out due to patient leaving prior to being seen by health care provider: Secondary | ICD-10-CM | POA: Diagnosis not present

## 2020-04-30 DIAGNOSIS — Y999 Unspecified external cause status: Secondary | ICD-10-CM | POA: Diagnosis not present

## 2020-04-30 DIAGNOSIS — Y929 Unspecified place or not applicable: Secondary | ICD-10-CM | POA: Diagnosis not present

## 2020-04-30 NOTE — ED Triage Notes (Signed)
Pt was trying to open a box and cut his L middle finger with a box cutter, bleeding now controlled, small 1cm lac, last tetanus unknown
